# Patient Record
Sex: Male | Born: 1970 | Race: White | Hispanic: No | Marital: Married | Smoking: Current every day smoker
Health system: Southern US, Community
[De-identification: ages and names within clinical notes are randomized; demographics above are authoritative.]

## PROBLEM LIST (undated history)

## (undated) DIAGNOSIS — I10 Essential (primary) hypertension: Secondary | ICD-10-CM

## (undated) DIAGNOSIS — T7840XA Allergy, unspecified, initial encounter: Secondary | ICD-10-CM

## (undated) DIAGNOSIS — N529 Male erectile dysfunction, unspecified: Secondary | ICD-10-CM

## (undated) DIAGNOSIS — E785 Hyperlipidemia, unspecified: Secondary | ICD-10-CM

## (undated) DIAGNOSIS — E039 Hypothyroidism, unspecified: Secondary | ICD-10-CM

## (undated) DIAGNOSIS — N189 Chronic kidney disease, unspecified: Secondary | ICD-10-CM

## (undated) HISTORY — DX: Allergy, unspecified, initial encounter: T78.40XA

## (undated) HISTORY — DX: Hyperlipidemia, unspecified: E78.5

## (undated) HISTORY — DX: Hypothyroidism, unspecified: E03.9

## (undated) HISTORY — PX: GANGLION CYST EXCISION: SHX1691

## (undated) HISTORY — PX: WRIST SURGERY: SHX841

## (undated) HISTORY — DX: Male erectile dysfunction, unspecified: N52.9

## (undated) HISTORY — PX: THUMB AMPUTATION: SHX804

## (undated) HISTORY — DX: Essential (primary) hypertension: I10

## (undated) HISTORY — PX: REPAIR KNEE LIGAMENT: SUR1188

## (undated) HISTORY — DX: Chronic kidney disease, unspecified: N18.9

---

## 2011-03-27 ENCOUNTER — Encounter (HOSPITAL_BASED_OUTPATIENT_CLINIC_OR_DEPARTMENT_OTHER)
Admission: RE | Admit: 2011-03-27 | Discharge: 2011-03-27 | Disposition: A | Discharge status: Home / Self Care | Payer: BC Managed Care – PPO | Source: Ambulatory Visit | Attending: Orthopedic Surgery | Admitting: Orthopedic Surgery

## 2011-03-27 LAB — BASIC METABOLIC PANEL
BUN: 25 mg/dL — ABNORMAL HIGH (ref 6–23)
Chloride: 101 mEq/L (ref 96–112)
Creatinine, Ser: 0.93 mg/dL (ref 0.4–1.5)
GFR calc Af Amer: >60 mL/min (ref >60)
GFR calc non Af Amer: >60 mL/min (ref >60)
Potassium: 3.5 mEq/L (ref 3.5–5.1)

## 2011-03-28 ENCOUNTER — Ambulatory Visit (HOSPITAL_BASED_OUTPATIENT_CLINIC_OR_DEPARTMENT_OTHER)
Admission: RE | Admit: 2011-03-28 | Discharge: 2011-03-28 | Disposition: A | Discharge status: Home / Self Care | Payer: BC Managed Care – PPO | Source: Ambulatory Visit | Attending: Orthopedic Surgery | Admitting: Orthopedic Surgery

## 2011-03-28 ENCOUNTER — Other Ambulatory Visit: Payer: Self-pay | Admitting: Orthopedic Surgery

## 2011-03-28 DIAGNOSIS — I10 Essential (primary) hypertension: Secondary | ICD-10-CM | POA: Insufficient documentation

## 2011-03-28 DIAGNOSIS — Z01812 Encounter for preprocedural laboratory examination: Secondary | ICD-10-CM | POA: Insufficient documentation

## 2011-03-28 DIAGNOSIS — F17200 Nicotine dependence, unspecified, uncomplicated: Secondary | ICD-10-CM | POA: Insufficient documentation

## 2011-03-28 DIAGNOSIS — M6740 Ganglion, unspecified site: Secondary | ICD-10-CM | POA: Insufficient documentation

## 2011-03-28 LAB — POCT HEMOGLOBIN-HEMACUE: Hemoglobin: 14.4 g/dL (ref 13.0–17.0)

## 2011-04-01 NOTE — Op Note (Signed)
NAME:  Jeremiah Turner, Jeremiah Turner NO.:  [id number]  MEDICAL RECORD NO.:  [id number]          PATIENT TYPE:  LOCATION:                                 FACILITY:  PHYSICIAN:  Katy Fitch. Karlen Barbar, M.D.      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  OPERATION:  Resection of complex dorsal myxomatous mass emanating from dorsal aspect of scapholunate ligament.  OPERATIONS:  Resection of dorsal myxomatous mass left wrist with left wrist arthrotomy and debridement of scapholunate ligament and electrocautery of superficial fibers of ligament.  OPERATING SURGEON:  Katy Fitch. Kali Deadwyler, MD  ASSISTANT:  Marveen Reeks Dasnoit, PA-C  ANESTHESIA:  General by LMA.  SUPERVISING ANESTHESIOLOGIST:  Dr. Sampson Goon.  INDICATIONS:  Jeremiah Turner is a 40 year old gentleman referred for evaluation and management of a large dorsal wrist mass.  Clinical examination revealed evidence of a large myxomatous cyst.  This was painful when he placed his hand in dorsiflexion and load-bearing or full palmar flexion.  X-rays of his wrist revealed a cyst within the radial aspect of the lunate consistent with interosseous ganglion.  We detailed informed consent Jeremiah Turner.  Past experience with interosseous cyst of lunate has been unsatisfying with respect to attempts at excision and bone grafting.  I recommended that based on years of experience and in my judgment he will be better off with simple excision of his dorsal cyst and observation  of the interosseous cyst. His cyst did not appear to be large enough to cause a pathologic fracture of the lunate.  After informed consent, he was brought to the operating room at this time.  As part of our informed consent, we did point out that myxoid cyst phenomenon is poorly understood.  There is a high incidence of recurrence.  Questions regarding this particular invited and answered in detail.  PROCEDURE IN DETAILS:  Jeremiah Turner was brought to room 1 of the [facility] and placed in supine position on the operating table.  Following the induction of general anesthesia by LMA technique, the left arm was prepped with Betadine soap and solution and sterilely draped.  A pneumatic tourniquet was applied to the proximal brachium.  On exsanguination of left arm with Esmarch bandage, arterial tourniquet was inflated to 250 mmHg.  Procedure commenced with a short transverse incision over the dorsal aspect of the wrist.  Subcutaneous tissue was carefully divided and taken care to identify the extensor retinaculum and the myxoid cyst.  The cyst had a violaceous appearance due to some blood elements within the myxoid material.  This circumferentially dissected down to the level of the wrist capsule.  The cyst drained of its contents and subsequently excised  with electrocautery following its stalk down to the level of scapholunate ligament.  The midcarpal joint was unremarkable.  The radiocarpal joint was entered in a very small area to allow exposure of the dorsal aspect of scapholunate ligament.  This was cleaned to normal appearing fibers with a micro rongeur.  Electrocautery was used to desiccate the cells on the superficial surface of the scapholunate ligament.  The wound was then irrigated followed by repair of the skin with subcutaneous suture of 4-0 Vicryl and intradermal 3-0 Prolene.  A compressive dressing was applied with a gauze pad directly over the dead space created by  cyst excision.  There were no apparent complications.  For aftercare, Jeremiah Turner is provided prescription for Vicodin 5 mg 1 p.o. q.4-6 h. p.r.n. pain.  He was provided 1 g of Ancef IV prophylactic antibiotic due to joint entry.     Katy Fitch Mindy Gali, M.D.     RVS/MEDQ  D:  03/28/2011  T:  03/29/2011  Job:  [id number]  cc:   Jeremiah Turner  Electronically Signed by Josephine Igo M.D. on 04/01/2011 08:27:44 AM

## 2012-11-18 ENCOUNTER — Encounter: Payer: Self-pay | Admitting: Internal Medicine

## 2012-12-06 ENCOUNTER — Ambulatory Visit: Payer: BC Managed Care – PPO | Admitting: Internal Medicine

## 2014-01-19 ENCOUNTER — Other Ambulatory Visit: Payer: Self-pay | Admitting: Dermatology

## 2017-07-23 DIAGNOSIS — Z720 Tobacco use: Secondary | ICD-10-CM | POA: Diagnosis not present

## 2017-07-23 DIAGNOSIS — A630 Anogenital (venereal) warts: Secondary | ICD-10-CM | POA: Diagnosis not present

## 2017-07-23 DIAGNOSIS — Z1389 Encounter for screening for other disorder: Secondary | ICD-10-CM | POA: Diagnosis not present

## 2017-07-23 DIAGNOSIS — I10 Essential (primary) hypertension: Secondary | ICD-10-CM | POA: Diagnosis not present

## 2017-07-23 DIAGNOSIS — E784 Other hyperlipidemia: Secondary | ICD-10-CM | POA: Diagnosis not present

## 2017-08-31 ENCOUNTER — Other Ambulatory Visit: Payer: Self-pay | Admitting: Urology

## 2017-08-31 DIAGNOSIS — A630 Anogenital (venereal) warts: Secondary | ICD-10-CM | POA: Diagnosis not present

## 2017-08-31 NOTE — H&P (Signed)
Urology Preoperative H&P   Chief Complaint: Penile condyloma  History of Present Illness: Jeremiah Turner is a 46 y.o. 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  He states that he has noticed the lesions for approximately 5 years and has tried topical therapy with little positive results.  He also reports penile pain and skin discomfort, secondary to the lesions, when having intercourse    Past Medical History:  Diagnosis Date  . ED (erectile dysfunction)   . Hyperlipidemia   . Hypertension   . Hypothyroidism     Past Surgical History:  Procedure Laterality Date  . GANGLION CYST EXCISION    . REPAIR KNEE LIGAMENT    . THUMB AMPUTATION    . WRIST SURGERY      Allergies: Allergies not on file  No family history on file.  Social History:  reports that he has been smoking.  He does not have any smokeless tobacco history on file. He reports that he does not drink alcohol or use drugs.  ROS: A complete review of systems was performed.  All systems are negative except for pertinent findings as noted.  Physical Exam:  Vital signs in last 24 hours: BP: ()/()  Arterial Line BP: ()/()  Constitutional:  Alert and oriented, No acute distress Cardiovascular: Regular rate and rhythm, No JVD Respiratory: Normal respiratory effort, Lungs clear bilaterally GI: Abdomen is soft, nontender, nondistended, no abdominal masses GU: No CVA tenderness,  Two 1 cm penile condyloma involving the left lateral base and a 2 mm condyloma involving the right lateral distal penile shaft Lymphatic: No lymphadenopathy Neurologic: Grossly intact, no focal deficits Psychiatric: Normal mood and affect   Laboratory Data:  No results for input(s): WBC, HGB, HCT, PLT in the last 72 hours.  No results for input(s): NA, K, CL, GLUCOSE, BUN, CALCIUM, CREATININE in the last 72 hours.  Invalid input(s): CO3   No results found for this or any previous visit  (from the past 24 hour(s)). No results found for this or any previous visit (from the past 240 hour(s)).  Renal Function: No results for input(s): CREATININE in the last 168 hours. CrCl cannot be calculated (Patient's most recent lab result is older than the maximum 21 days allowed.).  Radiologic Imaging: No results found.  I independently reviewed the above imaging studies.  Assessment and Plan Jeremiah Turner is a 46 y.o. 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  Risks include bleeding, infection, recurrence of the lesions and scar tissue formation.  He voices understanding and wishes to proceed.     Rhoderick Moody, MD 08/31/2017, 12:20 PM  [facility] Pager: [phone]

## 2017-11-04 ENCOUNTER — Encounter (HOSPITAL_BASED_OUTPATIENT_CLINIC_OR_DEPARTMENT_OTHER): Admission: RE | Payer: Self-pay | Source: Ambulatory Visit

## 2017-11-04 ENCOUNTER — Ambulatory Visit (HOSPITAL_BASED_OUTPATIENT_CLINIC_OR_DEPARTMENT_OTHER): Admission: RE | Admit: 2017-11-04 | Payer: BLUE CROSS/BLUE SHIELD | Source: Ambulatory Visit | Admitting: Urology

## 2017-11-04 SURGERY — DESTRUCTION, LESION, GENITALIA
Anesthesia: Monitor Anesthesia Care

## 2018-01-29 DIAGNOSIS — Z1389 Encounter for screening for other disorder: Secondary | ICD-10-CM | POA: Diagnosis not present

## 2018-01-29 DIAGNOSIS — I10 Essential (primary) hypertension: Secondary | ICD-10-CM | POA: Diagnosis not present

## 2018-01-29 DIAGNOSIS — Z720 Tobacco use: Secondary | ICD-10-CM | POA: Diagnosis not present

## 2018-01-29 DIAGNOSIS — A630 Anogenital (venereal) warts: Secondary | ICD-10-CM | POA: Diagnosis not present

## 2018-04-27 DIAGNOSIS — A630 Anogenital (venereal) warts: Secondary | ICD-10-CM | POA: Insufficient documentation

## 2018-06-11 DIAGNOSIS — I10 Essential (primary) hypertension: Secondary | ICD-10-CM | POA: Diagnosis not present

## 2018-06-11 DIAGNOSIS — N528 Other male erectile dysfunction: Secondary | ICD-10-CM | POA: Diagnosis not present

## 2018-06-11 DIAGNOSIS — A630 Anogenital (venereal) warts: Secondary | ICD-10-CM | POA: Diagnosis not present

## 2018-06-11 DIAGNOSIS — Z720 Tobacco use: Secondary | ICD-10-CM | POA: Diagnosis not present

## 2018-07-08 DIAGNOSIS — A630 Anogenital (venereal) warts: Secondary | ICD-10-CM | POA: Diagnosis not present

## 2018-07-22 DIAGNOSIS — R82998 Other abnormal findings in urine: Secondary | ICD-10-CM | POA: Diagnosis not present

## 2018-07-23 DIAGNOSIS — Z125 Encounter for screening for malignant neoplasm of prostate: Secondary | ICD-10-CM | POA: Diagnosis not present

## 2018-07-23 DIAGNOSIS — E038 Other specified hypothyroidism: Secondary | ICD-10-CM | POA: Diagnosis not present

## 2018-07-23 DIAGNOSIS — Z0000 Encounter for general adult medical examination without abnormal findings: Secondary | ICD-10-CM | POA: Diagnosis not present

## 2018-07-23 DIAGNOSIS — I10 Essential (primary) hypertension: Secondary | ICD-10-CM | POA: Diagnosis not present

## 2018-07-26 DIAGNOSIS — Z720 Tobacco use: Secondary | ICD-10-CM | POA: Diagnosis not present

## 2018-07-26 DIAGNOSIS — N528 Other male erectile dysfunction: Secondary | ICD-10-CM | POA: Diagnosis not present

## 2018-07-26 DIAGNOSIS — A630 Anogenital (venereal) warts: Secondary | ICD-10-CM | POA: Diagnosis not present

## 2018-07-26 DIAGNOSIS — I10 Essential (primary) hypertension: Secondary | ICD-10-CM | POA: Diagnosis not present

## 2018-07-26 DIAGNOSIS — Z0000 Encounter for general adult medical examination without abnormal findings: Secondary | ICD-10-CM | POA: Diagnosis not present

## 2018-07-30 DIAGNOSIS — Z1212 Encounter for screening for malignant neoplasm of rectum: Secondary | ICD-10-CM | POA: Diagnosis not present

## 2018-08-30 DIAGNOSIS — M542 Cervicalgia: Secondary | ICD-10-CM | POA: Diagnosis not present

## 2018-08-30 DIAGNOSIS — M4722 Other spondylosis with radiculopathy, cervical region: Secondary | ICD-10-CM | POA: Diagnosis not present

## 2018-09-07 DIAGNOSIS — M542 Cervicalgia: Secondary | ICD-10-CM | POA: Diagnosis not present

## 2018-10-21 DIAGNOSIS — G959 Disease of spinal cord, unspecified: Secondary | ICD-10-CM | POA: Diagnosis not present

## 2018-10-21 DIAGNOSIS — M542 Cervicalgia: Secondary | ICD-10-CM | POA: Diagnosis not present

## 2018-10-21 DIAGNOSIS — M5412 Radiculopathy, cervical region: Secondary | ICD-10-CM | POA: Diagnosis not present

## 2018-10-21 DIAGNOSIS — M4802 Spinal stenosis, cervical region: Secondary | ICD-10-CM | POA: Diagnosis not present

## 2018-11-04 DIAGNOSIS — M5020 Other cervical disc displacement, unspecified cervical region: Secondary | ICD-10-CM | POA: Diagnosis not present

## 2018-11-04 DIAGNOSIS — M4712 Other spondylosis with myelopathy, cervical region: Secondary | ICD-10-CM | POA: Diagnosis not present

## 2018-11-04 DIAGNOSIS — M50023 Cervical disc disorder at C6-C7 level with myelopathy: Secondary | ICD-10-CM | POA: Diagnosis not present

## 2018-11-04 DIAGNOSIS — M50323 Other cervical disc degeneration at C6-C7 level: Secondary | ICD-10-CM | POA: Diagnosis not present

## 2018-11-04 DIAGNOSIS — M4802 Spinal stenosis, cervical region: Secondary | ICD-10-CM | POA: Diagnosis not present

## 2018-12-08 DIAGNOSIS — M5412 Radiculopathy, cervical region: Secondary | ICD-10-CM | POA: Diagnosis not present

## 2019-01-12 ENCOUNTER — Ambulatory Visit: Payer: Self-pay

## 2019-01-12 ENCOUNTER — Other Ambulatory Visit: Payer: Self-pay | Admitting: Family Medicine

## 2019-01-12 DIAGNOSIS — S20211A Contusion of right front wall of thorax, initial encounter: Secondary | ICD-10-CM

## 2019-01-27 DIAGNOSIS — Z720 Tobacco use: Secondary | ICD-10-CM | POA: Diagnosis not present

## 2019-01-27 DIAGNOSIS — E038 Other specified hypothyroidism: Secondary | ICD-10-CM | POA: Diagnosis not present

## 2019-01-27 DIAGNOSIS — I10 Essential (primary) hypertension: Secondary | ICD-10-CM | POA: Diagnosis not present

## 2019-09-23 IMAGING — DX DG RIBS W/ CHEST 3+V*R*
3 series · 3 of 3 positions shown · non-contrast
Comparison: None.

CLINICAL DATA: Right anterior chest wall pain since falling
yesterday.

EXAM:
RIGHT RIBS AND CHEST - 3+ VIEW

[chest pa]
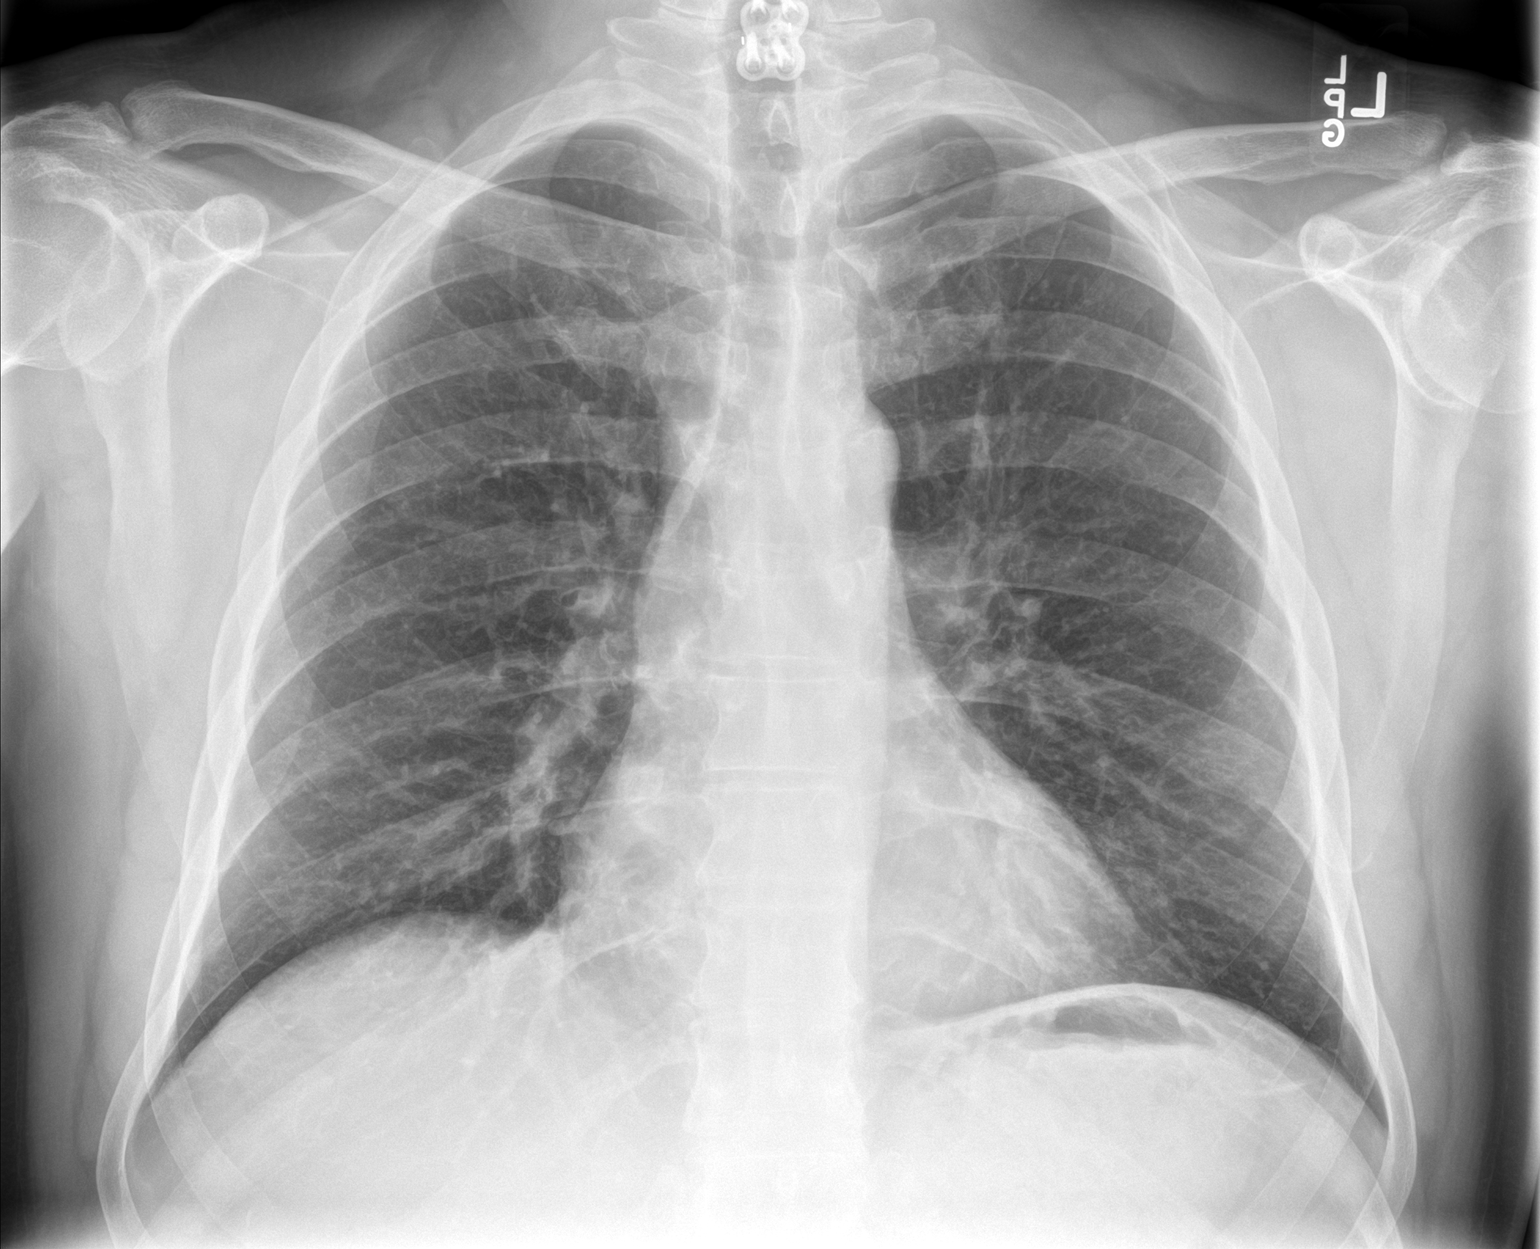

[rib pa]
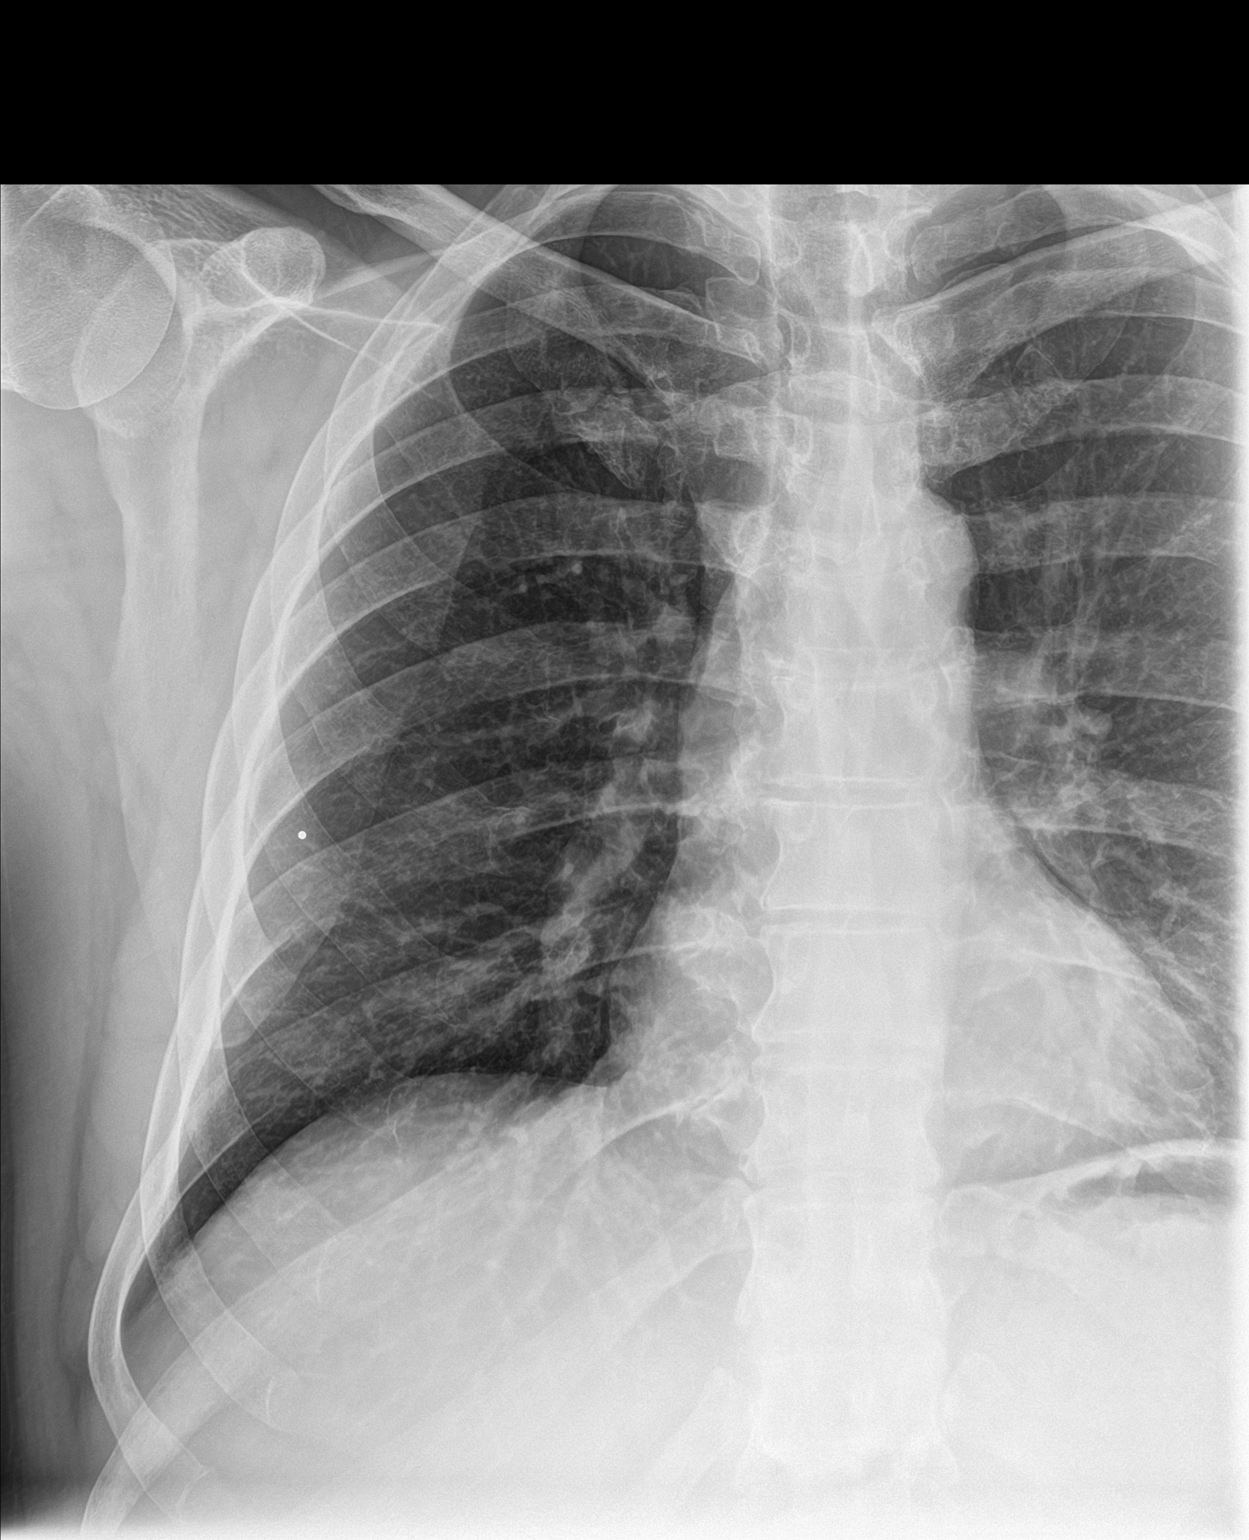

[rib ap]
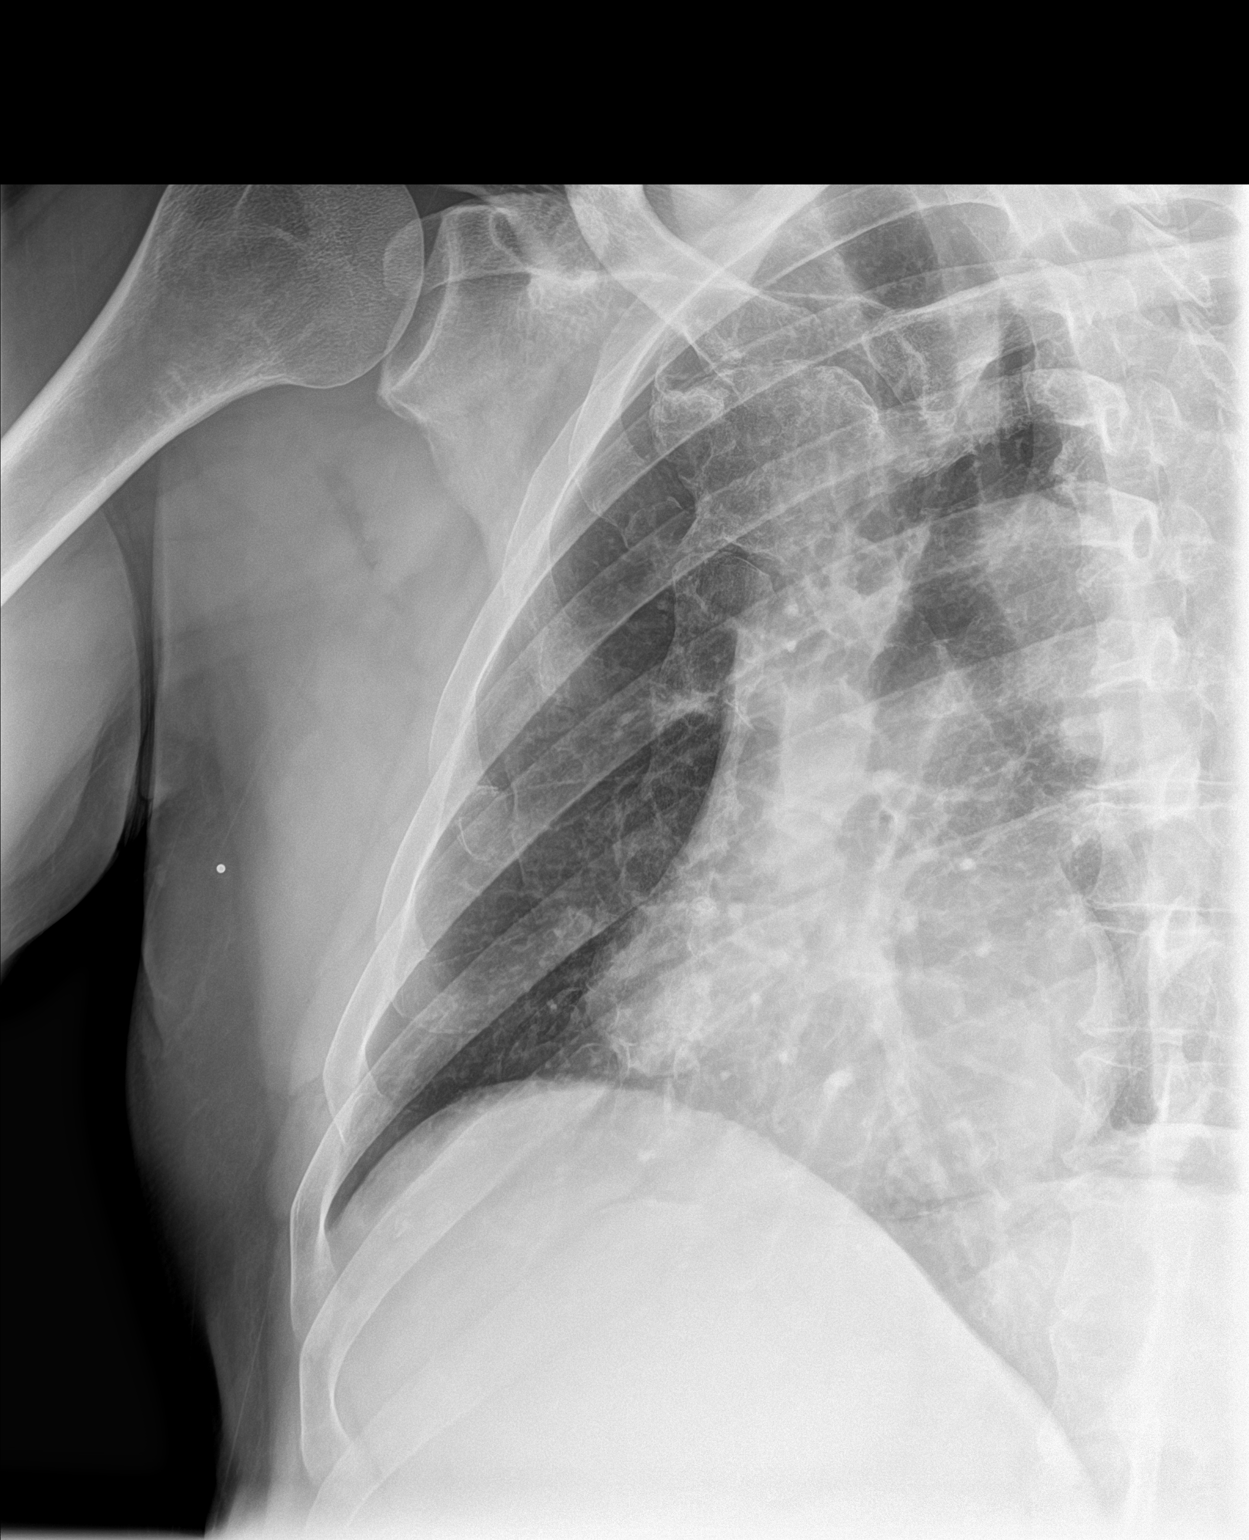

[3 of 3 positions shown; findings below may reference images not displayed]

FINDINGS: The heart size and mediastinal contours are normal. The lungs are
clear. There is no pleural effusion or pneumothorax. Patient is
status post lower cervical fusion.

Right rib series performed with a metallic BB over the area of pain
anteriorly in the mid right chest. No rib fracture or focal rib
lesion identified.
IMPRESSION: No evidence of acute right-sided rib fracture, pleural effusion or
pneumothorax.

## 2020-07-10 DIAGNOSIS — Z125 Encounter for screening for malignant neoplasm of prostate: Secondary | ICD-10-CM | POA: Diagnosis not present

## 2020-07-10 DIAGNOSIS — E7849 Other hyperlipidemia: Secondary | ICD-10-CM | POA: Diagnosis not present

## 2020-07-10 DIAGNOSIS — D563 Thalassemia minor: Secondary | ICD-10-CM | POA: Diagnosis not present

## 2020-07-10 DIAGNOSIS — I10 Essential (primary) hypertension: Secondary | ICD-10-CM | POA: Diagnosis not present

## 2020-07-10 DIAGNOSIS — E038 Other specified hypothyroidism: Secondary | ICD-10-CM | POA: Diagnosis not present

## 2020-07-10 DIAGNOSIS — Z0000 Encounter for general adult medical examination without abnormal findings: Secondary | ICD-10-CM | POA: Diagnosis not present

## 2021-07-05 DIAGNOSIS — E785 Hyperlipidemia, unspecified: Secondary | ICD-10-CM | POA: Diagnosis not present

## 2021-07-05 DIAGNOSIS — E039 Hypothyroidism, unspecified: Secondary | ICD-10-CM | POA: Diagnosis not present

## 2021-07-05 DIAGNOSIS — Z125 Encounter for screening for malignant neoplasm of prostate: Secondary | ICD-10-CM | POA: Diagnosis not present

## 2021-07-12 DIAGNOSIS — Z1339 Encounter for screening examination for other mental health and behavioral disorders: Secondary | ICD-10-CM | POA: Diagnosis not present

## 2021-07-12 DIAGNOSIS — Z0000 Encounter for general adult medical examination without abnormal findings: Secondary | ICD-10-CM | POA: Diagnosis not present

## 2021-07-12 DIAGNOSIS — I10 Essential (primary) hypertension: Secondary | ICD-10-CM | POA: Diagnosis not present

## 2021-07-12 DIAGNOSIS — Z1331 Encounter for screening for depression: Secondary | ICD-10-CM | POA: Diagnosis not present

## 2022-08-26 DIAGNOSIS — Z125 Encounter for screening for malignant neoplasm of prostate: Secondary | ICD-10-CM | POA: Diagnosis not present

## 2022-08-26 DIAGNOSIS — E785 Hyperlipidemia, unspecified: Secondary | ICD-10-CM | POA: Diagnosis not present

## 2022-08-26 DIAGNOSIS — Z1212 Encounter for screening for malignant neoplasm of rectum: Secondary | ICD-10-CM | POA: Diagnosis not present

## 2022-08-26 DIAGNOSIS — E039 Hypothyroidism, unspecified: Secondary | ICD-10-CM | POA: Diagnosis not present

## 2022-09-02 DIAGNOSIS — Z0000 Encounter for general adult medical examination without abnormal findings: Secondary | ICD-10-CM | POA: Diagnosis not present

## 2022-09-02 DIAGNOSIS — Z1339 Encounter for screening examination for other mental health and behavioral disorders: Secondary | ICD-10-CM | POA: Diagnosis not present

## 2022-09-02 DIAGNOSIS — Z1331 Encounter for screening for depression: Secondary | ICD-10-CM | POA: Diagnosis not present

## 2022-09-02 DIAGNOSIS — I10 Essential (primary) hypertension: Secondary | ICD-10-CM | POA: Diagnosis not present

## 2022-09-02 DIAGNOSIS — R82998 Other abnormal findings in urine: Secondary | ICD-10-CM | POA: Diagnosis not present

## 2023-04-13 ENCOUNTER — Encounter: Payer: Self-pay | Admitting: Family Medicine

## 2023-06-11 ENCOUNTER — Encounter: Payer: Self-pay | Admitting: Family Medicine

## 2023-06-11 ENCOUNTER — Ambulatory Visit (INDEPENDENT_AMBULATORY_CARE_PROVIDER_SITE_OTHER): Payer: Self-pay | Admitting: Family Medicine

## 2023-06-11 VITALS — BP 132/88 | HR 72 | Temp 97.6°F | Ht 71.0 in | Wt 203.0 lb

## 2023-06-11 DIAGNOSIS — E039 Hypothyroidism, unspecified: Secondary | ICD-10-CM

## 2023-06-11 DIAGNOSIS — F419 Anxiety disorder, unspecified: Secondary | ICD-10-CM

## 2023-06-11 DIAGNOSIS — F17210 Nicotine dependence, cigarettes, uncomplicated: Secondary | ICD-10-CM

## 2023-06-11 DIAGNOSIS — E785 Hyperlipidemia, unspecified: Secondary | ICD-10-CM

## 2023-06-11 DIAGNOSIS — I10 Essential (primary) hypertension: Secondary | ICD-10-CM

## 2023-06-11 DIAGNOSIS — Z7689 Persons encountering health services in other specified circumstances: Secondary | ICD-10-CM

## 2023-06-11 LAB — CBC WITH DIFFERENTIAL/PLATELET
Basophils Absolute: 0.1 10*3/uL (ref 0.0–0.1)
Basophils Relative: 0.5 % (ref 0.0–3.0)
Eosinophils Absolute: 0.1 10*3/uL (ref 0.0–0.7)
Eosinophils Relative: 0.9 % (ref 0.0–5.0)
HCT: 41.2 % (ref 39.0–52.0)
Hemoglobin: 13 g/dL (ref 13.0–17.0)
Lymphocytes Relative: 33.4 % (ref 12.0–46.0)
Lymphs Abs: 4.6 10*3/uL — ABNORMAL HIGH (ref 0.7–4.0)
MCHC: 31.5 g/dL (ref 30.0–36.0)
MCV: 60.9 fl — ABNORMAL LOW (ref 78.0–100.0)
Monocytes Absolute: 0.9 10*3/uL (ref 0.1–1.0)
Monocytes Relative: 6.6 % (ref 3.0–12.0)
Neutro Abs: 8.1 10*3/uL — ABNORMAL HIGH (ref 1.4–7.7)
Neutrophils Relative %: 58.6 % (ref 43.0–77.0)
Platelets: 293 10*3/uL (ref 150.0–400.0)
RBC: 6.76 Mil/uL — ABNORMAL HIGH (ref 4.22–5.81)
RDW: 16.3 % — ABNORMAL HIGH (ref 11.5–15.5)
WBC: 13.7 10*3/uL — ABNORMAL HIGH (ref 4.0–10.5)

## 2023-06-11 LAB — COMPREHENSIVE METABOLIC PANEL
ALT: 26 U/L (ref 0–53)
AST: 16 U/L (ref 0–37)
Albumin: 4.6 g/dL (ref 3.5–5.2)
Alkaline Phosphatase: 67 U/L (ref 39–117)
BUN: 25 mg/dL — ABNORMAL HIGH (ref 6–23)
CO2: 28 mEq/L (ref 19–32)
Calcium: 9.5 mg/dL (ref 8.4–10.5)
Chloride: 105 mEq/L (ref 96–112)
Creatinine, Ser: 1.01 mg/dL (ref 0.40–1.50)
GFR: 84.58 mL/min (ref >60.00)
Glucose, Bld: 89 mg/dL (ref 70–99)
Potassium: 4.1 mEq/L (ref 3.5–5.1)
Sodium: 140 mEq/L (ref 135–145)
Total Bilirubin: 0.5 mg/dL (ref 0.2–1.2)
Total Protein: 7.1 g/dL (ref 6.0–8.3)

## 2023-06-11 LAB — TSH: TSH: 3.01 u[IU]/mL (ref 0.35–5.50)

## 2023-06-11 MED ORDER — LEVOTHYROXINE SODIUM 100 MCG PO TABS: 100.0000 ug | ORAL_TABLET | Freq: Every day | ORAL | 1 refills | Status: DC

## 2023-06-11 MED ORDER — AMLODIPINE BESYLATE 5 MG PO TABS: 5.0000 mg | ORAL_TABLET | Freq: Every day | ORAL | 1 refills | Status: DC

## 2023-06-11 NOTE — Progress Notes (Addendum)
New Patient Office Visit  Subjective    Patient ID: Jeremiah Turner, male    DOB: [birth year]  Age: 52 y.o. MRN: [id number]  CC:  Chief Complaint  Patient presents with   Establish Care    No concerns, wants refills    HPI Jeremiah Turner presents to establish care Previous medical care: Dr. Ivery Quale   HTN- out of amlodipine 5 mg x 1 week.   Hypothyroidism- 100 mg   Anxiety and stress- Sertraline 100 mg   HLD- Crestor daily   Smokes 1- 1.5 ppd x 15 years and less prior to that Started smoking at age 16    Works at [employer] in maintenance.  Married  1 son      06/11/2023    3:52 PM  Depression screen PHQ 2/9  Decreased Interest 0  Down, Depressed, Hopeless 0  PHQ - 2 Score 0     Outpatient Encounter Medications as of 06/11/2023  Medication Sig   rosuvastatin (CRESTOR) 20 MG tablet Take 20 mg by mouth daily.   sertraline (ZOLOFT) 100 MG tablet    [DISCONTINUED] amLODipine (NORVASC) 5 MG tablet Take 5 mg by mouth daily.   [DISCONTINUED] levothyroxine (SYNTHROID) 100 MCG tablet Take by mouth.   amLODipine (NORVASC) 5 MG tablet Take 1 tablet (5 mg total) by mouth daily.   levothyroxine (SYNTHROID) 100 MCG tablet Take 1 tablet (100 mcg total) by mouth daily before breakfast.   No facility-administered encounter medications on file as of 06/11/2023.    Past Medical History:  Diagnosis Date   ED (erectile dysfunction)    Hyperlipidemia    Hypertension    Hypothyroidism     Past Surgical History:  Procedure Laterality Date   GANGLION CYST EXCISION     REPAIR KNEE LIGAMENT     THUMB AMPUTATION     WRIST SURGERY      History reviewed. No pertinent family history.  Social History   Socioeconomic History   Marital status: Married    Spouse name: Not on file   Number of children: 1   Years of education: Not on file   Highest education level: Not on file  Occupational History    Employer: [employer]  Tobacco Use   Smoking status: Every Day    Current  packs/day: 1.50    Average packs/day: 1.5 packs/day for 30.0 years (45.0 ttl pk-yrs)    Types: Cigarettes   Smokeless tobacco: Not on file  Substance and Sexual Activity   Alcohol use: Yes    Alcohol/week: 4.0 standard drinks of alcohol    Types: 4 Standard drinks or equivalent per week   Drug use: No   Sexual activity: Yes    Birth control/protection: None  Other Topics Concern   Not on file  Social History Narrative   Not on file   Social Determinants of Health   Financial Resource Strain: Not on file  Food Insecurity: Not on file  Transportation Needs: Not on file  Physical Activity: Not on file  Stress: Not on file  Social Connections: Unknown (04/13/2022)   Received from [facility]   Social Network    Social Network: Not on file  Intimate Partner Violence: Unknown (03/06/2022)   Received from [facility]   HITS    Physically Hurt: Not on file    Insult or Talk Down To: Not on file    Threaten Physical Harm: Not on file    Scream or Curse: Not on file  Review of Systems  Constitutional:  Negative for chills and fever.  Respiratory:  Negative for cough and shortness of breath.   Cardiovascular:  Negative for chest pain and palpitations.  Gastrointestinal:  Negative for abdominal pain, constipation, diarrhea, nausea and vomiting.  Genitourinary:  Negative for dysuria, frequency and urgency.  Neurological:  Negative for dizziness, weakness and headaches.  Psychiatric/Behavioral:  Negative for depression. The patient is not nervous/anxious.         Objective    BP 132/88 (BP Location: Left Arm, Patient Position: Sitting, Cuff Size: Large)   Pulse 72   Temp 97.6 F (36.4 C) (Temporal)   Ht 5\' 11"  (1.803 m)   Wt 203 lb (92.1 kg)   SpO2 97%   BMI 28.31 kg/m   Physical Exam Constitutional:      General: He is not in acute distress.    Appearance: He is not ill-appearing.  Eyes:     Extraocular Movements: Extraocular movements intact.      Conjunctiva/sclera: Conjunctivae normal.  Cardiovascular:     Rate and Rhythm: Normal rate and regular rhythm.  Pulmonary:     Effort: Pulmonary effort is normal.     Breath sounds: Normal breath sounds.  Musculoskeletal:     Cervical back: Normal range of motion and neck supple.  Skin:    General: Skin is warm and dry.  Neurological:     General: No focal deficit present.     Mental Status: He is alert and oriented to person, place, and time.  Psychiatric:        Mood and Affect: Mood normal.        Behavior: Behavior normal.        Thought Content: Thought content normal.         Assessment & Plan:   Problem List Items Addressed This Visit   None Visit Diagnoses     Primary hypertension    -  Primary   Relevant Medications   rosuvastatin (CRESTOR) 20 MG tablet   amLODipine (NORVASC) 5 MG tablet   Other Relevant Orders   CBC with Differential/Platelet (Completed)   Comprehensive metabolic panel (Completed)   TSH (Completed)   Encounter to establish care       Acquired hypothyroidism       Relevant Medications   levothyroxine (SYNTHROID) 100 MCG tablet   Other Relevant Orders   TSH (Completed)   Hyperlipidemia, unspecified hyperlipidemia type       Relevant Medications   rosuvastatin (CRESTOR) 20 MG tablet   amLODipine (NORVASC) 5 MG tablet   Anxiety       Relevant Medications   sertraline (ZOLOFT) 100 MG tablet   Smoking greater than 30 pack years           He is a pleasant 52 year old male who is new to the practice and here to establish care.  Restart amlodipine.  Monitor blood pressure.  Recommend low-sodium diet.  Recommend smoking cessation.  Check renal function. Check TSH and refill levothyroxine as appropriate. He is not fasting.  He will return for fasting CPE.  Anxiety is controlled on sertraline. Request medical records from previous PCP office.  Return for fasting CPE at their convenience.   Hetty Blend, NP-C

## 2023-06-11 NOTE — Addendum Note (Signed)
Addended by: Avanell Shackleton on: 06/11/2023 08:27 PM   Modules accepted: Orders

## 2023-06-12 ENCOUNTER — Encounter: Payer: Self-pay | Admitting: Family Medicine

## 2023-06-12 DIAGNOSIS — I10 Essential (primary) hypertension: Secondary | ICD-10-CM

## 2023-06-12 DIAGNOSIS — E039 Hypothyroidism, unspecified: Secondary | ICD-10-CM

## 2023-06-12 MED ORDER — LEVOTHYROXINE SODIUM 100 MCG PO TABS: 100.0000 ug | ORAL_TABLET | Freq: Every day | ORAL | 1 refills | Status: DC

## 2023-06-12 MED ORDER — AMLODIPINE BESYLATE 5 MG PO TABS: 5.0000 mg | ORAL_TABLET | Freq: Every day | ORAL | 1 refills | Status: DC

## 2024-01-01 ENCOUNTER — Other Ambulatory Visit: Payer: Self-pay | Admitting: Family Medicine

## 2024-01-01 DIAGNOSIS — E039 Hypothyroidism, unspecified: Secondary | ICD-10-CM

## 2024-01-01 DIAGNOSIS — I10 Essential (primary) hypertension: Secondary | ICD-10-CM

## 2024-05-19 ENCOUNTER — Other Ambulatory Visit: Payer: Self-pay | Admitting: Family Medicine

## 2024-05-19 NOTE — Telephone Encounter (Unsigned)
 Copied from CRM [id number]. Topic: Clinical - Medication Refill >> May 19, 2024 11:14 AM Trula Gable C wrote: Medication: rosuvastatin (CRESTOR) 20 MG tablet  Has the patient contacted their pharmacy? Yes (Agent: If no, request that the patient contact the pharmacy for the refill. If patient does not wish to contact the pharmacy document the reason why and proceed with request.) (Agent: If yes, when and what did the pharmacy advise?)  This is the patient's preferred pharmacy:  [id number] [place], [place] - [address] [place] [address] Phone: [phone] Fax: [phone]    Is this the correct pharmacy for this prescription? Yes If no, delete pharmacy and type the correct one.   Has the prescription been filled recently? No  Is the patient out of the medication? Yes  Has the patient been seen for an appointment in the last year OR does the patient have an upcoming appointment? Yes  Can we respond through MyChart? Yes  Agent: Please be advised that Rx refills may take up to 3 business days. We ask that you follow-up with your pharmacy.

## 2024-07-05 ENCOUNTER — Other Ambulatory Visit: Payer: Self-pay | Admitting: Family Medicine

## 2024-07-06 ENCOUNTER — Other Ambulatory Visit: Payer: Self-pay | Admitting: Family Medicine

## 2024-07-06 NOTE — Telephone Encounter (Signed)
 Copied from CRM #[id number]. Topic: Clinical - Medication Refill >> Jul 06, 2024 12:22 PM [place] A wrote: Medication: sertraline (ZOLOFT) 100 MG tablet;rosuvastatin (CRESTOR) 20 MG tablet  Has the patient contacted their pharmacy? No (Agent: If no, request that the patient contact the pharmacy for the refill. If patient does not wish to contact the pharmacy document the reason why and proceed with request.) (Agent: If yes, when and what did the pharmacy advise?)  This is the patient's preferred pharmacy:  [id number] [address], [place] - [address] [place] [address] Phone: [phone] Fax: [phone]   Is this the correct pharmacy for this prescription? Yes If no, delete pharmacy and type the correct one.   Has the prescription been filled recently? No  Is the patient out of the medication? Yes  Has the patient been seen for an appointment in the last year OR does the patient have an upcoming appointment? Yes  Can we respond through MyChart? Yes  Agent: Please be advised that Rx refills may take up to 3 business days. We ask that you follow-up with your pharmacy.

## 2024-07-09 ENCOUNTER — Ambulatory Visit (HOSPITAL_COMMUNITY)
Admission: EM | Admit: 2024-07-09 | Discharge: 2024-07-09 | Disposition: A | Discharge status: Home / Self Care | Payer: Self-pay | Attending: Psychiatry | Admitting: Psychiatry

## 2024-07-09 ENCOUNTER — Inpatient Hospital Stay: Admission: RE | Admit: 2024-07-09 | Payer: Self-pay | Source: Ambulatory Visit

## 2024-07-09 DIAGNOSIS — Z760 Encounter for issue of repeat prescription: Secondary | ICD-10-CM | POA: Insufficient documentation

## 2024-07-09 DIAGNOSIS — F411 Generalized anxiety disorder: Secondary | ICD-10-CM | POA: Diagnosis present

## 2024-07-09 MED ORDER — SERTRALINE HCL 100 MG PO TABS: 100.0000 mg | ORAL_TABLET | Freq: Every day | ORAL | 0 refills | Status: DC

## 2024-07-09 NOTE — Discharge Instructions (Signed)
 Discharge recommendations:   Medications: 14 day supply of Sertraline  100mg  [facility] Patient is to take medications as prescribed. The patient or patient's guardian is to contact a medical professional and/or outpatient provider to address any new side effects that develop. The patient or the patient's guardian should update outpatient providers of any new medications and/or medication changes.    Outpatient Follow up: Please review list of outpatient resources for psychiatry and counseling. Please follow up with your primary care provider for all medical related needs.    Therapy: We recommend that patient participate in individual therapy to address mental health concerns.   Safety:   The following safety precautions should be taken:   No sharp objects. This includes scissors, razors, scrapers, and putty knives.   Chemicals should be removed and locked up.   Medications should be removed and locked up.   Weapons should be removed and locked up. This includes firearms, knives and instruments that can be used to cause injury.   The patient should abstain from use of illicit substances/drugs and abuse of any medications.  If symptoms worsen or do not continue to improve or if the patient becomes actively suicidal or homicidal then it is recommended that the patient return to the closest hospital emergency department, the [facility], or call 911 for further evaluation and treatment. National Suicide Prevention Lifeline 1-800-SUICIDE or 332-351-4428.  About 988 988 offers 24/7 access to trained crisis counselors who can help people experiencing mental health-related distress. People can call or text 988 or chat 988lifeline.org for themselves or if they are worried about a loved one who may need crisis support.

## 2024-07-09 NOTE — ED Provider Notes (Signed)
 Behavioral Health Urgent Care Medical Screening Exam  Patient Name: Jeremiah Turner MRN: [id number] Date of Evaluation: 07/09/24 Chief Complaint:  need my Zoloft  refilled Diagnosis:  Final diagnoses:  Generalized anxiety disorder  Encounter for medication refill    History of Present illness: Jeremiah Turner 53 y.o., male patient presented to [facility] as a voluntary walk in unaccompanied requesting a 2 week supply of his Sertraline  100 mg until he gets his insurance reinstated and can follow up with outpatient. Jeremiah Turner, is seen face to face by this provider and chart reviewed on 07/09/24.  Per chart review pt has a PPHx of GAD and has been prescribed Zoloft  for about 10 years. He is currently being prescribed Sertraline  100 mg daily by his PCP. He states that he is switching insurances with his job and can no longer afford to pay his PCP visit out of pocket. He is states that he should have coverage within the next 2 weeks and just needs enough to bridge him until his insurance is active and he can continue receiving medication mgmt through his PCP. Dispense report was verified and pt did receive 90 day supply of Sertraline  100mg  April 2025 and has since ran out. He reports feeling anxious and brain fuzziness since running out 4 days ago. He denies SI, HI and psychotic symptoms. He reports sleeping well and having a good appetite. We discussed following up with PCP but also provided resources for outpatient psychiatry and therapy to help with anxiety symptoms. 14 day supply of medication sent to pharmacy.    During evaluation Jeremiah Turner is walking around outside, in no acute distress.  He is alert & oriented x 4, calm, cooperative and attentive for this assessment.  His mood is anxious with congruent affect.  He has normal speech, and behavior.  Objectively there is no evidence of psychosis/mania or delusional thinking. Pt does not appear to be responding to internal or external stimuli.   Patient is able to converse coherently, goal directed thoughts, no distractibility, or pre-occupation.  He denies current/ recent suicidal/self-harm/homicidal ideation, psychosis, and paranoia.  Patient answered assessment questions appropriately.      Flowsheet Row ED from 07/09/2024 in [facility]  C-SSRS RISK CATEGORY No Risk    Psychiatric Specialty Exam  Presentation  General Appearance:Appropriate for Environment  Eye Contact:Good  Speech:Clear and Coherent  Speech Volume:Normal  Handedness:Right   Mood and Affect  Mood: Anxious  Affect: Appropriate   Thought Process  Thought Processes: Coherent; Linear  Descriptions of Associations:Intact  Orientation:Full (Time, Place and Person)  Thought Content:WDL    Hallucinations:None  Ideas of Reference:None  Suicidal Thoughts:No  Homicidal Thoughts:No   Sensorium  Memory: Immediate Good; Recent Good  Judgment: Good  Insight: Good   Executive Functions  Concentration: Good  Attention Span: Good  Recall: Good  Fund of Knowledge: Good  Language: Good   Psychomotor Activity  Psychomotor Activity: Normal   Assets  Assets: Desire for Improvement; Housing; Physical Health; Social Support; Vocational/Educational   Sleep  Sleep: Good  Number of hours:  8   Physical Exam: Physical Exam Vitals and nursing note reviewed.  Constitutional:      Appearance: Normal appearance.  HENT:     Head: Normocephalic.     Nose: Nose normal.  Eyes:     Extraocular Movements: Extraocular movements intact.  Cardiovascular:     Rate and Rhythm: Normal rate.  Pulmonary:     Effort: Pulmonary effort  is normal.  Musculoskeletal:        General: Normal range of motion.     Cervical back: Normal range of motion.  Neurological:     General: No focal deficit present.     Mental Status: He is alert and oriented to person, place, and time.    Review of Systems   Constitutional: Negative.   HENT: Negative.    Eyes: Negative.   Respiratory: Negative.    Cardiovascular: Negative.   Gastrointestinal: Negative.   Genitourinary: Negative.   Musculoskeletal: Negative.   Neurological: Negative.   Endo/Heme/Allergies: Negative.   Psychiatric/Behavioral:  The patient is nervous/anxious.    Blood pressure (!) 141/89, pulse 65, temperature 99.2 F (37.3 C), temperature source Oral, resp. rate 18, SpO2 99%. There is no height or weight on file to calculate BMI.  Musculoskeletal: Strength & Muscle Tone: within normal limits Gait & Station: normal Patient leans: N/A   [facility] MSE Discharge Disposition for Follow up and Recommendations: Based on my evaluation the patient does not appear to have an emergency medical condition and can be discharged with resources and follow up care in outpatient services for Medication Management  Pt discharged with 14 day supply of Sertraline  100mg  daily to bridge him until his insurance becomes active and he can follow up with PCP. Educated on the medication. Discussed outpatient psychiatry and therapy options.   Alan JAYSON Mcardle, NP 07/09/2024, 2:41 PM

## 2024-07-09 NOTE — Progress Notes (Signed)
   07/09/24 1347  [facility] Triage Screening (Walk-ins at [facility] only)  How Did You Hear About Us ? Self  What Is the Reason for Your Visit/Call Today? Patient is a 53 year old male that presents as a voluntary walk in this date requesting medication management. Patient is currently prescribed Sertraline  100 mg for the last 10 years when he was diagnosed with MDD. Patient reported he has been prescribed that medication from his PCP Octavia MD) although has recently changed employers and providers. Patient states he now receives services from Orie Mackintosh NP [facility]) and his insurance will not go in effect for another 2 weeks and he can't afford to pay for an office visit to get his medication refilled. Patient states he only needs about two weeks worth, since his insurance will go in to effect until then. Patient denies any SI, HI or AVH.  Patient states he has been off his medication for 4 days now and reported side effects to include: tingling of the extremities and fuzzy headed.  How Long Has This Been Causing You Problems? <Week  Have You Recently Had Any Thoughts About Hurting Yourself? No  Are You Planning to Commit Suicide/Harm Yourself At This time? No  Have you Recently Had Thoughts About Hurting Someone Sherral? No  Are You Planning To Harm Someone At This Time? No  Physical Abuse Denies  Verbal Abuse Denies  Sexual Abuse Denies  Exploitation of patient/patient's resources Denies  Self-Neglect Denies  Possible abuse reported to: Other (Comment) (NA)  Are you currently experiencing any auditory, visual or other hallucinations? No  Have You Used Any Alcohol  or Drugs in the Past 24 Hours? No  Do you have any current medical co-morbidities that require immediate attention? No  Clinician description of patient physical appearance/behavior: Patient is cooperative and pleasant  What Do You Feel Would Help You the Most Today? Medication(s)  If access to [facility] was not available, would  you have sought care in the Emergency Department? No  Determination of Need Routine (7 days)  Options For Referral Medication Management

## 2024-07-22 ENCOUNTER — Ambulatory Visit (INDEPENDENT_AMBULATORY_CARE_PROVIDER_SITE_OTHER): Admitting: Family Medicine

## 2024-07-22 VITALS — BP 108/72 | HR 55 | Temp 97.8°F | Ht 71.0 in | Wt 195.0 lb

## 2024-07-22 DIAGNOSIS — Z125 Encounter for screening for malignant neoplasm of prostate: Secondary | ICD-10-CM

## 2024-07-22 DIAGNOSIS — I10 Essential (primary) hypertension: Secondary | ICD-10-CM

## 2024-07-22 DIAGNOSIS — Z1159 Encounter for screening for other viral diseases: Secondary | ICD-10-CM | POA: Diagnosis not present

## 2024-07-22 DIAGNOSIS — E785 Hyperlipidemia, unspecified: Secondary | ICD-10-CM | POA: Diagnosis not present

## 2024-07-22 DIAGNOSIS — E039 Hypothyroidism, unspecified: Secondary | ICD-10-CM

## 2024-07-22 DIAGNOSIS — Z1211 Encounter for screening for malignant neoplasm of colon: Secondary | ICD-10-CM

## 2024-07-22 DIAGNOSIS — F419 Anxiety disorder, unspecified: Secondary | ICD-10-CM

## 2024-07-22 LAB — COMPREHENSIVE METABOLIC PANEL WITH GFR
ALT: 21 U/L (ref 0–53)
AST: 17 U/L (ref 0–37)
Albumin: 4.6 g/dL (ref 3.5–5.2)
Alkaline Phosphatase: 61 U/L (ref 39–117)
BUN: 21 mg/dL (ref 6–23)
CO2: 27 meq/L (ref 19–32)
Calcium: 9 mg/dL (ref 8.4–10.5)
Chloride: 104 meq/L (ref 96–112)
Creatinine, Ser: 0.79 mg/dL (ref 0.40–1.50)
GFR: 100.24 mL/min (ref >60.00)
Glucose, Bld: 99 mg/dL (ref 70–99)
Potassium: 4.8 meq/L (ref 3.5–5.1)
Sodium: 140 meq/L (ref 135–145)
Total Bilirubin: 0.4 mg/dL (ref 0.2–1.2)
Total Protein: 6.8 g/dL (ref 6.0–8.3)

## 2024-07-22 LAB — CBC WITH DIFFERENTIAL/PLATELET
Basophils Absolute: 0.1 K/uL (ref 0.0–0.1)
Basophils Relative: 1.2 % (ref 0.0–3.0)
Eosinophils Absolute: 0.2 K/uL (ref 0.0–0.7)
Eosinophils Relative: 2 % (ref 0.0–5.0)
HCT: 41.3 % (ref 39.0–52.0)
Hemoglobin: 13 g/dL (ref 13.0–17.0)
Lymphocytes Relative: 38.1 % (ref 12.0–46.0)
Lymphs Abs: 3.5 K/uL (ref 0.7–4.0)
MCHC: 31.4 g/dL (ref 30.0–36.0)
MCV: 61.1 fl — ABNORMAL LOW (ref 78.0–100.0)
Monocytes Absolute: 0.8 K/uL (ref 0.1–1.0)
Monocytes Relative: 8.9 % (ref 3.0–12.0)
Neutro Abs: 4.6 K/uL (ref 1.4–7.7)
Neutrophils Relative %: 49.8 % (ref 43.0–77.0)
Platelets: 266 K/uL (ref 150.0–400.0)
RBC: 6.75 Mil/uL — ABNORMAL HIGH (ref 4.22–5.81)
RDW: 16.6 % — ABNORMAL HIGH (ref 11.5–15.5)
WBC: 9.3 K/uL (ref 4.0–10.5)

## 2024-07-22 LAB — LIPID PANEL
Cholesterol: 211 mg/dL — ABNORMAL HIGH (ref 0–200)
HDL: 39.7 mg/dL (ref >39.00)
LDL Cholesterol: 107 mg/dL — ABNORMAL HIGH (ref 0–99)
NonHDL: 171.48
Total CHOL/HDL Ratio: 5
Triglycerides: 322 mg/dL — ABNORMAL HIGH (ref 0.0–149.0)
VLDL: 64.4 mg/dL — ABNORMAL HIGH (ref 0.0–40.0)

## 2024-07-22 LAB — TSH: TSH: 1.84 u[IU]/mL (ref 0.35–5.50)

## 2024-07-22 LAB — PSA: PSA: 0.9 ng/mL (ref 0.10–4.00)

## 2024-07-22 MED ORDER — ROSUVASTATIN CALCIUM 20 MG PO TABS: 20.0000 mg | ORAL_TABLET | Freq: Every day | ORAL | 1 refills | Status: AC

## 2024-07-22 MED ORDER — AMLODIPINE BESYLATE 5 MG PO TABS: 5.0000 mg | ORAL_TABLET | Freq: Every day | ORAL | 1 refills | Status: AC

## 2024-07-22 MED ORDER — SERTRALINE HCL 100 MG PO TABS: 100.0000 mg | ORAL_TABLET | Freq: Every day | ORAL | 3 refills | Status: AC

## 2024-07-22 NOTE — Progress Notes (Signed)
 Subjective:     Patient ID: Jeremiah Turner, male    DOB: August 13, 1971, 53 y.o.   MRN: [id number]  Chief Complaint  Patient presents with   Medical Management of Chronic Issues    Wants meds refilled    HPI  Discussed the use of AI scribe software for clinical note transcription with the patient, who gave verbal consent to proceed.  History of Present Illness Jeremiah Turner is a 53 year old male with hypertension, hypothyroidism, anxiety, and hyperlipidemia who presents for follow-up on chronic health conditions.  Hypertension - Managed with amlodipine  - Requires medication refill - No dizziness, headaches, chest pain, or palpitations  Hypothyroidism - Treated with levothyroxine  - Currently takes levothyroxine  after eating due to a shift change - Has not taken levothyroxine  today  Anxiety symptoms - Managed with sertraline  - Recent three-day lapse in sertraline  led to discomfort - Two sertraline  pills remaining  Hyperlipidemia and weight management - Treated with rosuvastatin  - Weight decreased from 213 to 195 pounds - Increased physical activity, averaging 15,000 to 18,000 steps daily  Tobacco use - Smokes cigarettes     Health Maintenance Due  Topic Date Due   HIV Screening  Never done   Hepatitis C Screening  Never done   DTaP/Tdap/Td (1 - Tdap) Never done   Pneumococcal Vaccine: 50+ Years (1 of 2 - PCV) Never done   Hepatitis B Vaccines 19-59 Average Risk (1 of 3 - 19+ 3-dose series) Never done   Colonoscopy  Never done   Lung Cancer Screening  Never done   Zoster Vaccines- Shingrix (1 of 2) Never done   COVID-19 Vaccine (4 - 2024-25 season) 08/02/2023   INFLUENZA VACCINE  07/01/2024    Past Medical History:  Diagnosis Date   ED (erectile dysfunction)    Hyperlipidemia    Hypertension    Hypothyroidism     Past Surgical History:  Procedure Laterality Date   GANGLION CYST EXCISION     REPAIR KNEE LIGAMENT     THUMB AMPUTATION     WRIST SURGERY       History reviewed. No pertinent family history.  Social History   Socioeconomic History   Marital status: Married    Spouse name: Not on file   Number of children: 1   Years of education: Not on file   Highest education level: GED or equivalent  Occupational History    Employer: [employer]  Tobacco Use   Smoking status: Every Day    Current packs/day: 1.50    Average packs/day: 1.5 packs/day for 30.0 years (45.0 ttl pk-yrs)    Types: Cigarettes   Smokeless tobacco: Not on file  Substance and Sexual Activity   Alcohol  use: Yes    Alcohol /week: 4.0 standard drinks of alcohol     Types: 4 Standard drinks or equivalent per week   Drug use: No   Sexual activity: Yes    Birth control/protection: None  Other Topics Concern   Not on file  Social History Narrative   Not on file   Social Drivers of Health   Financial Resource Strain: Low Risk  (07/22/2024)   Overall Financial Resource Strain (CARDIA)    Difficulty of Paying Living Expenses: Not very hard  Food Insecurity: No Food Insecurity (07/22/2024)   Hunger Vital Sign    Worried About Running Out of Food in the Last Year: Never true    Ran Out of Food in the Last Year: Never true  Transportation Needs: No Transportation Needs (  07/22/2024)   PRAPARE - Administrator, Civil Service (Medical): No    Lack of Transportation (Non-Medical): No  Physical Activity: Unknown (07/22/2024)   Exercise Vital Sign    Days of Exercise per Week: 5 days    Minutes of Exercise per Session: Not on file  Stress: No Stress Concern Present (07/22/2024)   [employer] of Occupational Health - Occupational Stress Questionnaire    Feeling of Stress: Not at all  Social Connections: Moderately Isolated (07/22/2024)   Social Connection and Isolation Panel    Frequency of Communication with Friends and Family: Once a week    Frequency of Social Gatherings with Friends and Family: Once a week    Attends Religious Services: More than 4  times per year    Active Member of [employer] or Organizations: No    Attends Engineer, structural: Not on file    Marital Status: Married  Intimate Partner Violence: Unknown (03/06/2022)   Received from [facility]   HITS    Physically Hurt: Not on file    Insult or Talk Down To: Not on file    Threaten Physical Harm: Not on file    Scream or Curse: Not on file    Outpatient Medications Prior to Visit  Medication Sig Dispense Refill   levothyroxine  (SYNTHROID ) 100 MCG tablet TAKE 1 TABLET BY MOUTH DAILY BEFORE BREAKFAST. 90 tablet 1   amLODipine  (NORVASC ) 5 MG tablet TAKE 1 TABLET (5 MG TOTAL) BY MOUTH DAILY. 90 tablet 1   rosuvastatin  (CRESTOR ) 20 MG tablet Take 20 mg by mouth daily.     sertraline  (ZOLOFT ) 100 MG tablet Take 1 tablet (100 mg total) by mouth daily. 14 tablet 0   No facility-administered medications prior to visit.    Allergies  Allergen Reactions   Aspirin Swelling    Review of Systems  Constitutional:  Negative for chills, fever and malaise/fatigue.  Eyes:  Negative for blurred vision and double vision.  Respiratory:  Negative for shortness of breath.   Cardiovascular:  Negative for chest pain, palpitations and leg swelling.  Gastrointestinal:  Negative for abdominal pain, constipation, diarrhea, nausea and vomiting.  Genitourinary:  Negative for dysuria, frequency and urgency.  Neurological:  Negative for dizziness, tingling, focal weakness and headaches.  Psychiatric/Behavioral:  Negative for depression. The patient is nervous/anxious.        Objective:    Physical Exam Constitutional:      General: He is not in acute distress.    Appearance: He is not ill-appearing.  HENT:     Mouth/Throat:     Mouth: Mucous membranes are moist.  Eyes:     Extraocular Movements: Extraocular movements intact.     Conjunctiva/sclera: Conjunctivae normal.  Cardiovascular:     Rate and Rhythm: Normal rate.  Pulmonary:     Effort: Pulmonary effort is  normal.  Musculoskeletal:        General: Normal range of motion.     Cervical back: Normal range of motion and neck supple.     Right lower leg: No edema.     Left lower leg: No edema.  Skin:    General: Skin is warm and dry.  Neurological:     General: No focal deficit present.     Mental Status: He is alert and oriented to person, place, and time.     Cranial Nerves: No cranial nerve deficit.     Motor: No weakness.     Coordination: Coordination normal.  Gait: Gait normal.  Psychiatric:        Mood and Affect: Mood normal.        Behavior: Behavior normal.        Thought Content: Thought content normal.      BP 108/72   Pulse (!) 55   Temp 97.8 F (36.6 C) (Temporal)   Ht 5' 11 (1.803 m)   Wt 195 lb (88.5 kg)   SpO2 95%   BMI 27.20 kg/m  Wt Readings from Last 3 Encounters:  07/22/24 195 lb (88.5 kg)  06/11/23 203 lb (92.1 kg)       Assessment & Plan:   Problem List Items Addressed This Visit   None Visit Diagnoses       Acquired hypothyroidism    -  Primary   Relevant Orders   TSH (Completed)     Primary hypertension       Relevant Medications   rosuvastatin  (CRESTOR ) 20 MG tablet   amLODipine  (NORVASC ) 5 MG tablet   Other Relevant Orders   CBC with Differential/Platelet (Completed)   Comprehensive metabolic panel with GFR (Completed)     Encounter for screening for other viral diseases       Relevant Orders   HIV Antibody (routine testing w rflx)   Hepatitis C antibody     Screening for prostate cancer       Relevant Orders   PSA (Completed)     Screen for colon cancer       Relevant Orders   Ambulatory referral to Gastroenterology     Hyperlipidemia, unspecified hyperlipidemia type       Relevant Medications   rosuvastatin  (CRESTOR ) 20 MG tablet   amLODipine  (NORVASC ) 5 MG tablet   Other Relevant Orders   Lipid panel (Completed)     Anxiety       Relevant Medications   sertraline  (ZOLOFT ) 100 MG tablet       Assessment and  Plan Assessment & Plan Hypertension Chronic hypertension, managed with amlodipine . No symptoms such as dizziness, headaches, chest pain, or palpitations. - Refill amlodipine  prescription.  Hypothyroidism Chronic hypothyroidism, managed with levothyroxine . Current dosing may need adjustment based on TSH levels. Inconsistent medication intake on an empty stomach due to shift work. - Order TSH test. - Adjust levothyroxine  dose based on TSH results. - Instruct to take levothyroxine  on an empty stomach   Hyperlipidemia Chronic hyperlipidemia, managed with rosuvastatin . Reports weight loss, potentially beneficial for cholesterol levels. - Refill rosuvastatin  prescription.  Tobacco use Chronic tobacco use. Declined pneumonia vaccine despite smoking history.  Screen for prostate cancer Prostate cancer screening is due. No recent PSA testing. - Order PSA blood test.  Screen for colon cancer No colonoscopy performed. Opted for direct referral to GI for colonoscopy. - Refer to GI for colonoscopy.  Anxiety Chronic anxiety, managed with sertraline . Experienced withdrawal symptoms after running out of medication. Temporary prescription provided by urgent care. - Refill sertraline  prescription.  General Health Maintenance Due for preventive healthcare measures including cancer screenings and vaccinations. Discussed lung cancer screening and tetanus vaccine. To consider lung cancer screening at next physical. Tetanus vaccine status to be checked. - Consider lung cancer screening with low dose CT at next physical. - Check tetanus vaccine status.     I have changed Jeremiah ORN. Nifong's rosuvastatin . I am also having him maintain his levothyroxine , amLODipine , and sertraline .  Meds ordered this encounter  Medications   rosuvastatin  (CRESTOR ) 20 MG tablet    Sig: Take 1 tablet (  20 mg total) by mouth daily.    Dispense:  90 tablet    Refill:  1   amLODipine  (NORVASC ) 5 MG tablet    Sig: Take 1  tablet (5 mg total) by mouth daily.    Dispense:  90 tablet    Refill:  1   sertraline  (ZOLOFT ) 100 MG tablet    Sig: Take 1 tablet (100 mg total) by mouth daily.    Dispense:  90 tablet    Refill:  3    Supervising Provider:   ROLLENE NORRIS A [[id number]]

## 2024-07-23 LAB — HIV ANTIBODY (ROUTINE TESTING W REFLEX): HIV 1&2 Ab, 4th Generation: NONREACTIVE

## 2024-07-23 LAB — HEPATITIS C ANTIBODY: Hepatitis C Ab: NONREACTIVE

## 2024-07-24 ENCOUNTER — Other Ambulatory Visit: Payer: Self-pay | Admitting: Family Medicine

## 2024-07-24 ENCOUNTER — Ambulatory Visit: Payer: Self-pay | Admitting: Family Medicine

## 2024-07-24 DIAGNOSIS — E039 Hypothyroidism, unspecified: Secondary | ICD-10-CM

## 2024-07-24 DIAGNOSIS — E785 Hyperlipidemia, unspecified: Secondary | ICD-10-CM

## 2024-07-24 MED ORDER — LEVOTHYROXINE SODIUM 100 MCG PO TABS: 100.0000 ug | ORAL_TABLET | Freq: Every day | ORAL | 1 refills | Status: AC

## 2024-07-24 NOTE — Progress Notes (Signed)
 Please ask him if he has been missing any doses of rosuvastatin . His LDL and triglycerides are elevated.

## 2024-08-17 ENCOUNTER — Encounter: Payer: Self-pay | Admitting: Family Medicine

## 2024-08-26 ENCOUNTER — Ambulatory Visit: Admitting: Family Medicine

## 2024-09-02 ENCOUNTER — Encounter: Payer: Self-pay | Admitting: Family Medicine

## 2024-09-02 ENCOUNTER — Ambulatory Visit (INDEPENDENT_AMBULATORY_CARE_PROVIDER_SITE_OTHER): Admitting: Family Medicine

## 2024-09-02 VITALS — BP 130/80 | HR 58 | Temp 97.8°F | Ht 71.0 in | Wt 198.8 lb

## 2024-09-02 DIAGNOSIS — E782 Mixed hyperlipidemia: Secondary | ICD-10-CM | POA: Diagnosis not present

## 2024-09-02 DIAGNOSIS — B351 Tinea unguium: Secondary | ICD-10-CM

## 2024-09-02 MED ORDER — TERBINAFINE HCL 250 MG PO TABS: 250.0000 mg | ORAL_TABLET | Freq: Every day | ORAL | 0 refills | Status: DC

## 2024-09-02 NOTE — Progress Notes (Signed)
 Subjective:     Patient ID: Jeremiah LELON Turner, male    DOB: 1970/12/30, 53 y.o.   MRN: [id number]  Chief Complaint  Patient presents with   Acute Visit    Rt great toe fungus ongoing x2 years, used otc meds improved the appearance of the nail but did not get rid of the fungus    HPI  Discussed the use of AI scribe software for clinical note transcription with the patient, who gave verbal consent to proceed.  History of Present Illness Jeremiah Turner is a 53 year old male who presents with potential toenail fungus.  Onychomycosis (toenail changes) - Discoloration and thickening affecting all toenails - Right foot more severely affected than left - Previously prescribed oral antifungal medication but did not complete the course  Dyslipidemia management - Resumed cholesterol medication six weeks ago after a period of discontinuation  Alcohol  use - Consumes approximately four to five alcoholic drinks per session, mainly on weekends, about three days per week - No known history of elevated liver function tests     Health Maintenance Due  Topic Date Due   DTaP/Tdap/Td (1 - Tdap) Never done   Pneumococcal Vaccine: 50+ Years (1 of 2 - PCV) Never done   Hepatitis B Vaccines 19-59 Average Risk (1 of 3 - 19+ 3-dose series) Never done   Colonoscopy  Never done   Lung Cancer Screening  Never done   Zoster Vaccines- Shingrix (1 of 2) Never done   Influenza Vaccine  Never done   COVID-19 Vaccine (4 - 2025-26 season) 08/01/2024    Past Medical History:  Diagnosis Date   ED (erectile dysfunction)    Hyperlipidemia    Hypertension    Hypothyroidism     Past Surgical History:  Procedure Laterality Date   GANGLION CYST EXCISION     REPAIR KNEE LIGAMENT     THUMB AMPUTATION     WRIST SURGERY      History reviewed. No pertinent family history.  Social History   Socioeconomic History   Marital status: Married    Spouse name: Not on file   Number of children: 1   Years of  education: Not on file   Highest education level: GED or equivalent  Occupational History    Employer: [employer]  Tobacco Use   Smoking status: Every Day    Current packs/day: 1.50    Average packs/day: 1.5 packs/day for 30.0 years (45.0 ttl pk-yrs)    Types: Cigarettes   Smokeless tobacco: Not on file  Substance and Sexual Activity   Alcohol  use: Yes    Alcohol /week: 4.0 standard drinks of alcohol     Types: 4 Standard drinks or equivalent per week   Drug use: No   Sexual activity: Yes    Birth control/protection: None  Other Topics Concern   Not on file  Social History Narrative   Not on file   Social Drivers of Health   Financial Resource Strain: Low Risk  (07/22/2024)   Overall Financial Resource Strain (CARDIA)    Difficulty of Paying Living Expenses: Not very hard  Food Insecurity: No Food Insecurity (07/22/2024)   Hunger Vital Sign    Worried About Running Out of Food in the Last Year: Never true    Ran Out of Food in the Last Year: Never true  Transportation Needs: No Transportation Needs (07/22/2024)   PRAPARE - Transportation    Lack of Transportation (Medical): No    Lack of Transportation (Non-Medical): No  Physical Activity: Unknown (07/22/2024)   Exercise Vital Sign    Days of Exercise per Week: 5 days    Minutes of Exercise per Session: Not on file  Stress: No Stress Concern Present (07/22/2024)   [employer] of Occupational Health - Occupational Stress Questionnaire    Feeling of Stress: Not at all  Social Connections: Moderately Isolated (07/22/2024)   Social Connection and Isolation Panel    Frequency of Communication with Friends and Family: Once a week    Frequency of Social Gatherings with Friends and Family: Once a week    Attends Religious Services: More than 4 times per year    Active Member of [employer] or Organizations: No    Attends Engineer, structural: Not on file    Marital Status: Married  Intimate Partner Violence: Unknown (03/06/2022)    Received from [facility]   HITS    Physically Hurt: Not on file    Insult or Talk Down To: Not on file    Threaten Physical Harm: Not on file    Scream or Curse: Not on file    Outpatient Medications Prior to Visit  Medication Sig Dispense Refill   amLODipine  (NORVASC ) 5 MG tablet Take 1 tablet (5 mg total) by mouth daily. 90 tablet 1   levothyroxine  (SYNTHROID ) 100 MCG tablet Take 1 tablet (100 mcg total) by mouth daily before breakfast. 90 tablet 1   rosuvastatin  (CRESTOR ) 20 MG tablet Take 1 tablet (20 mg total) by mouth daily. 90 tablet 1   sertraline  (ZOLOFT ) 100 MG tablet Take 1 tablet (100 mg total) by mouth daily. 90 tablet 3   No facility-administered medications prior to visit.    Allergies  Allergen Reactions   Aspirin Swelling    Review of Systems  Constitutional:  Negative for chills, fever and malaise/fatigue.  Respiratory:  Negative for shortness of breath.   Cardiovascular:  Negative for chest pain, palpitations and leg swelling.  Gastrointestinal:  Negative for abdominal pain, constipation, diarrhea, nausea and vomiting.  Genitourinary:  Negative for dysuria, frequency and urgency.  Neurological:  Negative for dizziness, focal weakness and headaches.       Objective:    Physical Exam Constitutional:      General: He is not in acute distress.    Appearance: He is not ill-appearing.  Eyes:     Extraocular Movements: Extraocular movements intact.     Conjunctiva/sclera: Conjunctivae normal.  Cardiovascular:     Rate and Rhythm: Normal rate.  Pulmonary:     Effort: Pulmonary effort is normal.  Musculoskeletal:     Cervical back: Normal range of motion and neck supple.  Skin:    General: Skin is warm and dry.     Comments: Yellowish, thickened toenails throughout   Neurological:     General: No focal deficit present.     Mental Status: He is alert and oriented to person, place, and time.  Psychiatric:        Mood and Affect: Mood normal.         Behavior: Behavior normal.        Thought Content: Thought content normal.      BP 130/80 (BP Location: Left Arm, Patient Position: Sitting)   Pulse (!) 58   Temp 97.8 F (36.6 C) (Temporal)   Ht 5' 11 (1.803 m)   Wt 198 lb 12.8 oz (90.2 kg)   SpO2 97%   BMI 27.73 kg/m  Wt Readings from Last 3 Encounters:  09/02/24 198  lb 12.8 oz (90.2 kg)  07/22/24 195 lb (88.5 kg)  06/11/23 203 lb (92.1 kg)       Assessment & Plan:   Problem List Items Addressed This Visit   None Visit Diagnoses       Onychomycosis    -  Primary   Relevant Medications   terbinafine (LAMISIL) 250 MG tablet     Mixed hyperlipidemia           Assessment and Plan Assessment & Plan Onychomycosis (toenail fungus) Chronic onychomycosis affecting multiple toenails, with the right foot more severely affected. Previous oral antifungal treatment was incomplete. Topical treatments are ineffective due to poor penetration and long nail growth cycle. Oral antifungal therapy is considered with liver function monitoring due to potential hepatotoxicity. He prefers oral antifungal therapy before considering podiatry referral. - Prescribe oral antifungal medication for one month - Monitor liver function after one month of treatment - Consider podiatry referral if no improvement or if liver function tests are elevated  Hyperlipidemia Hyperlipidemia with previous non-compliance to medication. Currently resumed medication for the past six weeks. Non-fasting state today, so lipid levels cannot be accurately assessed. - Continue current cholesterol medication - Schedule fasting lipid panel in four weeks     I am having Jeremiah Turner start on terbinafine. I am also having him maintain his rosuvastatin , amLODipine , sertraline , and levothyroxine .  Meds ordered this encounter  Medications   terbinafine (LAMISIL) 250 MG tablet    Sig: Take 1 tablet (250 mg total) by mouth daily.    Dispense:  30 tablet    Refill:  0     Supervising Provider:   ROLLENE NORRIS A [[id number]]

## 2024-09-02 NOTE — Patient Instructions (Signed)
 Take the medication by mouth as prescribed.  Follow-up with me before you are due for your next refill.  Come in fasting and hydrated and we will recheck your cholesterol as well.

## 2024-09-05 ENCOUNTER — Telehealth: Payer: Self-pay | Admitting: Pharmacy Technician

## 2024-09-05 ENCOUNTER — Other Ambulatory Visit (HOSPITAL_COMMUNITY): Payer: Self-pay

## 2024-09-05 NOTE — Telephone Encounter (Signed)
 Pharmacy Patient Advocate Encounter   Received notification from CoverMyMeds that prior authorization for Terbinafine HCl 250MG  tablets  is required/requested.   Insurance verification completed.   The patient is insured through [facility].   Per test claim: PA required; PA submitted to above mentioned insurance via Latent Key/confirmation #/EOC [id number] Status is pending

## 2024-09-06 ENCOUNTER — Other Ambulatory Visit: Payer: Self-pay | Admitting: Family Medicine

## 2024-09-06 NOTE — Telephone Encounter (Signed)
 Rx was denied, is there an alternative that can be used/sent?

## 2024-09-06 NOTE — Telephone Encounter (Signed)
 LM for pt to return my call

## 2024-09-06 NOTE — Telephone Encounter (Signed)
 Pharmacy Patient Advocate Encounter  Received notification from [facility] that Prior Authorization for Terbinafine HCl 250MG  tablets  has been DENIED.  Full denial letter will be uploaded to the media tab. See denial reason below.     PA #/Case ID/Reference #: [id number]

## 2024-09-28 ENCOUNTER — Encounter: Payer: Self-pay | Admitting: Gastroenterology

## 2024-09-29 ENCOUNTER — Other Ambulatory Visit: Payer: Self-pay | Admitting: Family Medicine

## 2024-09-30 ENCOUNTER — Ambulatory Visit: Admitting: Family Medicine

## 2024-10-13 ENCOUNTER — Other Ambulatory Visit: Payer: Self-pay | Admitting: Family Medicine

## 2024-10-13 ENCOUNTER — Ambulatory Visit (INDEPENDENT_AMBULATORY_CARE_PROVIDER_SITE_OTHER): Admitting: Family Medicine

## 2024-10-13 ENCOUNTER — Ambulatory Visit: Payer: Self-pay | Admitting: Family Medicine

## 2024-10-13 ENCOUNTER — Encounter: Payer: Self-pay | Admitting: Family Medicine

## 2024-10-13 VITALS — BP 122/68 | HR 68 | Temp 97.9°F | Ht 71.0 in | Wt 199.0 lb

## 2024-10-13 DIAGNOSIS — I10 Essential (primary) hypertension: Secondary | ICD-10-CM

## 2024-10-13 DIAGNOSIS — B351 Tinea unguium: Secondary | ICD-10-CM

## 2024-10-13 DIAGNOSIS — E782 Mixed hyperlipidemia: Secondary | ICD-10-CM

## 2024-10-13 DIAGNOSIS — Z79899 Other long term (current) drug therapy: Secondary | ICD-10-CM

## 2024-10-13 LAB — HEPATIC FUNCTION PANEL
ALT: 19 U/L (ref 0–53)
AST: 17 U/L (ref 0–37)
Albumin: 4.7 g/dL (ref 3.5–5.2)
Alkaline Phosphatase: 62 U/L (ref 39–117)
Bilirubin, Direct: 0.1 mg/dL (ref 0.0–0.3)
Total Bilirubin: 0.6 mg/dL (ref 0.2–1.2)
Total Protein: 7.2 g/dL (ref 6.0–8.3)

## 2024-10-13 LAB — LIPID PANEL
Cholesterol: 142 mg/dL (ref 0–200)
HDL: 48.3 mg/dL (ref >39.00)
LDL Cholesterol: 71 mg/dL (ref 0–99)
NonHDL: 93.28
Total CHOL/HDL Ratio: 3
Triglycerides: 111 mg/dL (ref 0.0–149.0)
VLDL: 22.2 mg/dL (ref 0.0–40.0)

## 2024-10-13 LAB — BASIC METABOLIC PANEL WITH GFR
BUN: 17 mg/dL (ref 6–23)
CO2: 27 meq/L (ref 19–32)
Calcium: 9.4 mg/dL (ref 8.4–10.5)
Chloride: 103 meq/L (ref 96–112)
Creatinine, Ser: 0.78 mg/dL (ref 0.40–1.50)
GFR: 100.46 mL/min (ref >60.00)
Glucose, Bld: 86 mg/dL (ref 70–99)
Potassium: 4.3 meq/L (ref 3.5–5.1)
Sodium: 138 meq/L (ref 135–145)

## 2024-10-13 MED ORDER — TERBINAFINE HCL 250 MG PO TABS: 250.0000 mg | ORAL_TABLET | Freq: Every day | ORAL | 1 refills | Status: DC

## 2024-10-13 NOTE — Addendum Note (Signed)
 Addended by: Summit Arroyave L on: 10/13/2024 04:40 PM   Modules accepted: Orders

## 2024-10-13 NOTE — Progress Notes (Addendum)
 Subjective:     Patient ID: Jeremiah Turner, male    DOB: [birth year], 53 y.o.   MRN: [id number]  Chief Complaint  Patient presents with   Medical Management of Chronic Issues    F/u on toe and fasting to recheck cholesterol     HPI  Discussed the use of AI scribe software for clinical note transcription with the patient, who gave verbal consent to proceed.  History of Present Illness Jeremiah Turner is a 53 year old male with onychomycosis and hyperlipidemia who presents for follow-up.  Onychomycosis - Undergoing treatment for four weeks with noticeable improvement in both feet - nails are improving with obvious visible areas - New nail growth observed with estimated 20-30% improvement - One foot less severely affected than the other  Hyperlipidemia - On medication for one month - Fasted for cholesterol testing at this visit - Previous cholesterol levels elevated due to prior non-compliance with medication - Thyroid levels previously normal - Hopeful for decrease in triglyceride levels  Impact of work schedule - Works second shift     Health Maintenance Due  Topic Date Due   DTaP/Tdap/Td (1 - Tdap) Never done   Pneumococcal Vaccine: 50+ Years (1 of 2 - PCV) Never done   Hepatitis B Vaccines 19-59 Average Risk (1 of 3 - 19+ 3-dose series) Never done   Zoster Vaccines- Shingrix (1 of 2) Never done   Colonoscopy  Never done   Lung Cancer Screening  Never done   Influenza Vaccine  Never done   COVID-19 Vaccine (4 - 2025-26 season) 08/01/2024    Past Medical History:  Diagnosis Date   ED (erectile dysfunction)    Hyperlipidemia    Hypertension    Hypothyroidism     Past Surgical History:  Procedure Laterality Date   GANGLION CYST EXCISION     REPAIR KNEE LIGAMENT     THUMB AMPUTATION     WRIST SURGERY      History reviewed. No pertinent family history.  Social History   Socioeconomic History   Marital status: Married    Spouse name: Not on file    Number of children: 1   Years of education: Not on file   Highest education level: GED or equivalent  Occupational History    Employer: [employer]  Tobacco Use   Smoking status: Every Day    Current packs/day: 1.50    Average packs/day: 1.5 packs/day for 30.0 years (45.0 ttl pk-yrs)    Types: Cigarettes   Smokeless tobacco: Not on file  Substance and Sexual Activity   Alcohol  use: Yes    Alcohol /week: 4.0 standard drinks of alcohol     Types: 4 Standard drinks or equivalent per week   Drug use: No   Sexual activity: Yes    Birth control/protection: None  Other Topics Concern   Not on file  Social History Narrative   Not on file   Social Drivers of Health   Financial Resource Strain: Low Risk  (07/22/2024)   Overall Financial Resource Strain (CARDIA)    Difficulty of Paying Living Expenses: Not very hard  Food Insecurity: No Food Insecurity (07/22/2024)   Hunger Vital Sign    Worried About Running Out of Food in the Last Year: Never true    Ran Out of Food in the Last Year: Never true  Transportation Needs: No Transportation Needs (07/22/2024)   PRAPARE - Transportation    Lack of Transportation (Medical): No    Lack of Transportation (Non-Medical):  No  Physical Activity: Unknown (07/22/2024)   Exercise Vital Sign    Days of Exercise per Week: 5 days    Minutes of Exercise per Session: Not on file  Stress: No Stress Concern Present (07/22/2024)   [employer] of Occupational Health - Occupational Stress Questionnaire    Feeling of Stress: Not at all  Social Connections: Moderately Isolated (07/22/2024)   Social Connection and Isolation Panel    Frequency of Communication with Friends and Family: Once a week    Frequency of Social Gatherings with Friends and Family: Once a week    Attends Religious Services: More than 4 times per year    Active Member of [employer] or Organizations: No    Attends Engineer, Structural: Not on file    Marital Status: Married  Intimate  Partner Violence: Unknown (03/06/2022)   Received from [facility]   HITS    Physically Hurt: Not on file    Insult or Talk Down To: Not on file    Threaten Physical Harm: Not on file    Scream or Curse: Not on file    Outpatient Medications Prior to Visit  Medication Sig Dispense Refill   amLODipine  (NORVASC ) 5 MG tablet Take 1 tablet (5 mg total) by mouth daily. 90 tablet 1   levothyroxine  (SYNTHROID ) 100 MCG tablet Take 1 tablet (100 mcg total) by mouth daily before breakfast. 90 tablet 1   rosuvastatin  (CRESTOR ) 20 MG tablet Take 1 tablet (20 mg total) by mouth daily. 90 tablet 1   sertraline  (ZOLOFT ) 100 MG tablet Take 1 tablet (100 mg total) by mouth daily. 90 tablet 3   terbinafine (LAMISIL) 250 MG tablet Take 1 tablet (250 mg total) by mouth daily. 30 tablet 0   No facility-administered medications prior to visit.    Allergies  Allergen Reactions   Aspirin Swelling    Review of Systems  Constitutional:  Negative for chills and fever.  Respiratory:  Negative for shortness of breath.   Cardiovascular:  Negative for chest pain, palpitations and leg swelling.  Gastrointestinal:  Negative for abdominal pain, constipation, diarrhea, nausea and vomiting.  Genitourinary:  Negative for dysuria, frequency and urgency.  Neurological:  Negative for dizziness and headaches.       Objective:    Physical Exam Constitutional:      General: He is not in acute distress.    Appearance: He is not ill-appearing.  Eyes:     Extraocular Movements: Extraocular movements intact.     Conjunctiva/sclera: Conjunctivae normal.  Cardiovascular:     Rate and Rhythm: Normal rate.  Pulmonary:     Effort: Pulmonary effort is normal.  Musculoskeletal:     Cervical back: Normal range of motion and neck supple.  Skin:    General: Skin is warm and dry.     Comments: Great toenails with yellowing and thickening of nails bilaterally with clearing noted to distal nailbed   Neurological:      General: No focal deficit present.     Mental Status: He is alert and oriented to person, place, and time.  Psychiatric:        Mood and Affect: Mood normal.        Behavior: Behavior normal.        Thought Content: Thought content normal.      BP 122/68   Pulse 68   Temp 97.9 F (36.6 C) (Temporal)   Ht 5' 11 (1.803 m)   Wt 199 lb (90.3 kg)  SpO2 97%   BMI 27.75 kg/m  Wt Readings from Last 3 Encounters:  10/13/24 199 lb (90.3 kg)  09/02/24 198 lb 12.8 oz (90.2 kg)  07/22/24 195 lb (88.5 kg)       Assessment & Plan:   Problem List Items Addressed This Visit   None Visit Diagnoses       Mixed hyperlipidemia    -  Primary   Relevant Orders   Lipid panel (Completed)     Onychomycosis       Relevant Medications   terbinafine (LAMISIL) 250 MG tablet     Primary hypertension       Relevant Orders   Basic metabolic panel with GFR (Completed)   Hepatic function panel (Completed)     High risk medication use       Relevant Orders   Basic metabolic panel with GFR (Completed)   Hepatic function panel (Completed)       Assessment and Plan Assessment & Plan Onychomycosis Improving with treatment, showing a clearing and new nail growth. The other foot is less affected. Treatment duration can extend up to twelve weeks. Liver function needs monitoring due to potential side effects of the medication. If not fully resolved after three months, laser therapy at podiatry is an option. - Continue current treatment for onychomycosis. - Monitor liver function tests. - Will consider laser therapy at podiatry if not fully resolved after three months.  Mixed hyperlipidemia Managed with rosuvastatin . Cholesterol levels were not optimal previously due to non-adherence. He has been on medication for a month and is fasting for cholesterol testing today. - Ordered fasting lipid panel. - Continue rosuvastatin  20 MG oral daily.     I am having Jeremiah Turner maintain his  rosuvastatin , amLODipine , sertraline , levothyroxine , and terbinafine.  Meds ordered this encounter  Medications   terbinafine (LAMISIL) 250 MG tablet    Sig: Take 1 tablet (250 mg total) by mouth daily.    Dispense:  30 tablet    Refill:  1    Supervising Provider:   ROLLENE NORRIS A [[id number]]

## 2024-10-14 ENCOUNTER — Telehealth: Payer: Self-pay

## 2024-10-14 ENCOUNTER — Other Ambulatory Visit (HOSPITAL_COMMUNITY): Payer: Self-pay

## 2024-10-14 NOTE — Telephone Encounter (Signed)
 Pharmacy Patient Advocate Encounter   Received notification from RX Request Messages that prior authorization for Terbinafine 250mg  tabs is required/requested.   Insurance verification completed.   The patient is insured through [facility].   Per test claim: PA required; PA submitted to above mentioned insurance via Latent Key/confirmation #/EOC [id number] Status is pending

## 2024-10-17 NOTE — Telephone Encounter (Signed)
 Terbinafine 250mg  tabs has been DENIED

## 2024-10-17 NOTE — Telephone Encounter (Signed)
 Pharmacy Patient Advocate Encounter  Received notification from [facility] that Prior Authorization for Terbinafine 250mg  tabs has been DENIED.  See denial reason below. No denial letter attached in CMM. Will attach denial letter to Media tab once received.   PA #/Case ID/Reference #: [id number]

## 2024-10-18 NOTE — Telephone Encounter (Signed)
 LM for pt can discuss if he wants to return call or can respond via mychart

## 2024-10-26 ENCOUNTER — Ambulatory Visit (AMBULATORY_SURGERY_CENTER): Admitting: *Deleted

## 2024-10-26 ENCOUNTER — Encounter: Payer: Self-pay | Admitting: Gastroenterology

## 2024-10-26 VITALS — Ht 71.0 in | Wt 199.0 lb

## 2024-10-26 DIAGNOSIS — Z1211 Encounter for screening for malignant neoplasm of colon: Secondary | ICD-10-CM

## 2024-10-26 MED ORDER — NA SULFATE-K SULFATE-MG SULF 17.5-3.13-1.6 GM/177ML PO SOLN: 1.0000 | Freq: Once | ORAL | 0 refills | Status: AC

## 2024-10-26 NOTE — Progress Notes (Signed)
 Pt's name and DOB verified at the beginning of the pre-visit with 2 identifiers  Pt denies any difficulty with ambulating,sitting, laying down or rolling side to side  Pt has no issues moving head neck or swallowing  No egg or soy allergy known to patient   No issues known to pt with past sedation  No FH of Malignant Hyperthermia  Pt is not on home 02   Pt is not on blood thinners   Pt denies issues with constipation   Pt is not on dialysis  Pt denise any abnormal heart rhythms   Pt denies any upcoming cardiac testing  Patient's chart reviewed by Norleen Schillings CNRA prior to pre-visit and patient appropriate for the [facility].  Pre-visit completed and red dot placed by patient's name on their procedure day (on provider's schedule).     Visit by phone   Pt states weight is 199 lb   Pt given  both [facility] main # and MD on call # prior to instructions.  Informed pt to come in at the time discussed and is shown on PV instructions.  Pt instructed to use Singlecare.com or GoodRx for a price reduction on prep  Instructed pt where to find PV instructions in My Chart.  Instructed pt on all aspects of written instructions including med holds clothing to wear and foods to eat and not eat as well as after procedure legal restrictions and to call MD on call if needed.. Pt states understanding. Instructed pt to review instructions again prior to procedure and call main # given if has any questions or any issues. Pt states they will.

## 2024-11-01 ENCOUNTER — Encounter: Payer: Self-pay | Admitting: Gastroenterology

## 2024-11-03 ENCOUNTER — Encounter: Payer: Self-pay | Admitting: Gastroenterology

## 2024-11-04 ENCOUNTER — Ambulatory Visit: Admitting: Gastroenterology

## 2024-11-04 ENCOUNTER — Encounter: Payer: Self-pay | Admitting: Gastroenterology

## 2024-11-04 VITALS — BP 140/66 | HR 47 | Temp 98.2°F | Resp 11 | Ht 71.0 in | Wt 199.0 lb

## 2024-11-04 DIAGNOSIS — D128 Benign neoplasm of rectum: Secondary | ICD-10-CM

## 2024-11-04 DIAGNOSIS — D124 Benign neoplasm of descending colon: Secondary | ICD-10-CM

## 2024-11-04 DIAGNOSIS — Z1211 Encounter for screening for malignant neoplasm of colon: Secondary | ICD-10-CM

## 2024-11-04 DIAGNOSIS — D125 Benign neoplasm of sigmoid colon: Secondary | ICD-10-CM

## 2024-11-04 DIAGNOSIS — K621 Rectal polyp: Secondary | ICD-10-CM

## 2024-11-04 DIAGNOSIS — D123 Benign neoplasm of transverse colon: Secondary | ICD-10-CM

## 2024-11-04 DIAGNOSIS — K635 Polyp of colon: Secondary | ICD-10-CM | POA: Diagnosis not present

## 2024-11-04 MED ORDER — SODIUM CHLORIDE 0.9 % IV SOLN: 500.0000 mL | Freq: Once | INTRAVENOUS | Status: DC

## 2024-11-04 NOTE — Progress Notes (Signed)
 Sedate, gd SR, tolerated procedure well, VSS, report to RN

## 2024-11-04 NOTE — Progress Notes (Signed)
 GASTROENTEROLOGY PROCEDURE H&P NOTE   Primary Care Physician: Lendia Boby CROME, NP-C    Reason for Procedure:  Colon Cancer screening  Plan:    Colonoscopy  Patient is appropriate for endoscopic procedure(s) in the ambulatory ([facility]) setting.  The nature of the procedure, as well as the risks, benefits, and alternatives were carefully and thoroughly reviewed with the patient. Ample time for discussion and questions allowed. The patient understood, was satisfied, and agreed to proceed. I personally addressed all patient questions and concerns.     HPI: Jeremiah Turner is a 53 y.o. male who presents for colonoscopy for routine Colon Cancer screening.  No active GI symptoms.  No known family history of colon cancer or related malignancy.  Patient is otherwise without complaints or active issues today.  Past Medical History:  Diagnosis Date   Allergy    Chronic kidney disease    stones   ED (erectile dysfunction)    Hyperlipidemia    Hypertension    Hypothyroidism     Past Surgical History:  Procedure Laterality Date   GANGLION CYST EXCISION     REPAIR KNEE LIGAMENT     THUMB AMPUTATION     WRIST SURGERY      Prior to Admission medications   Medication Sig Start Date End Date Taking? Authorizing Provider  amLODipine  (NORVASC ) 5 MG tablet Take 1 tablet (5 mg total) by mouth daily. 07/22/24  Yes Henson, Vickie L, NP-C  levothyroxine  (SYNTHROID ) 100 MCG tablet Take 1 tablet (100 mcg total) by mouth daily before breakfast. 07/24/24  Yes Henson, Vickie L, NP-C  rosuvastatin  (CRESTOR ) 20 MG tablet Take 1 tablet (20 mg total) by mouth daily. 07/22/24  Yes Henson, Vickie L, NP-C  sertraline  (ZOLOFT ) 100 MG tablet Take 1 tablet (100 mg total) by mouth daily. 07/22/24  Yes Henson, Vickie L, NP-C  terbinafine  (LAMISIL ) 250 MG tablet Take 1 tablet (250 mg total) by mouth daily. 10/13/24   Lendia Boby CROME, NP-C    Current Outpatient Medications  Medication Sig Dispense Refill    amLODipine  (NORVASC ) 5 MG tablet Take 1 tablet (5 mg total) by mouth daily. 90 tablet 1   levothyroxine  (SYNTHROID ) 100 MCG tablet Take 1 tablet (100 mcg total) by mouth daily before breakfast. 90 tablet 1   rosuvastatin  (CRESTOR ) 20 MG tablet Take 1 tablet (20 mg total) by mouth daily. 90 tablet 1   sertraline  (ZOLOFT ) 100 MG tablet Take 1 tablet (100 mg total) by mouth daily. 90 tablet 3   terbinafine  (LAMISIL ) 250 MG tablet Take 1 tablet (250 mg total) by mouth daily. 30 tablet 1   Current Facility-Administered Medications  Medication Dose Route Frequency Provider Last Rate Last Admin   0.9 %  sodium chloride  infusion  500 mL Intravenous Once Lynnley Doddridge V, DO        Allergies as of 11/04/2024   (No Known Allergies)    Family History  Problem Relation Age of Onset   Rectal cancer Neg Hx    Stomach cancer Neg Hx    Esophageal cancer Neg Hx    Colon cancer Neg Hx    Colon polyps Neg Hx     Social History   Socioeconomic History   Marital status: Married    Spouse name: Not on file   Number of children: 1   Years of education: Not on file   Highest education level: GED or equivalent  Occupational History    Employer: [employer]  Tobacco Use  Smoking status: Every Day    Current packs/day: 1.50    Average packs/day: 1.5 packs/day for 30.0 years (45.0 ttl pk-yrs)    Types: Cigarettes   Smokeless tobacco: Not on file  Vaping Use   Vaping status: Never Used  Substance and Sexual Activity   Alcohol  use: Yes    Alcohol /week: 4.0 standard drinks of alcohol     Types: 4 Standard drinks or equivalent per week   Drug use: No   Sexual activity: Yes    Birth control/protection: None  Other Topics Concern   Not on file  Social History Narrative   Not on file   Social Drivers of Health   Financial Resource Strain: Low Risk  (07/22/2024)   Overall Financial Resource Strain (CARDIA)    Difficulty of Paying Living Expenses: Not very hard  Food Insecurity: No Food Insecurity  (07/22/2024)   Hunger Vital Sign    Worried About Running Out of Food in the Last Year: Never true    Ran Out of Food in the Last Year: Never true  Transportation Needs: No Transportation Needs (07/22/2024)   PRAPARE - Transportation    Lack of Transportation (Medical): No    Lack of Transportation (Non-Medical): No  Physical Activity: Unknown (07/22/2024)   Exercise Vital Sign    Days of Exercise per Week: 5 days    Minutes of Exercise per Session: Not on file  Stress: No Stress Concern Present (07/22/2024)   [employer] of Occupational Health - Occupational Stress Questionnaire    Feeling of Stress: Not at all  Social Connections: Moderately Isolated (07/22/2024)   Social Connection and Isolation Panel    Frequency of Communication with Friends and Family: Once a week    Frequency of Social Gatherings with Friends and Family: Once a week    Attends Religious Services: More than 4 times per year    Active Member of [employer] or Organizations: No    Attends Engineer, Structural: Not on file    Marital Status: Married  Intimate Partner Violence: Unknown (03/06/2022)   Received from [facility]   HITS    Physically Hurt: Not on file    Insult or Talk Down To: Not on file    Threaten Physical Harm: Not on file    Scream or Curse: Not on file    Physical Exam: Vital signs in last 24 hours: @BP  (!) 142/85   Pulse 60   Temp 98.2 F (36.8 C)   Ht 5' 11 (1.803 m)   Wt 199 lb (90.3 kg)   SpO2 97%   BMI 27.75 kg/m  GEN: NAD EYE: Sclerae anicteric ENT: MMM CV: Non-tachycardic Pulm: CTA b/l GI: Soft, NT/ND NEURO:  Alert & Oriented x 3   Sandor Flatter, DO [facility]   11/04/2024 2:30 PM

## 2024-11-04 NOTE — Progress Notes (Signed)
 Called to room to assist during endoscopic procedure.  Patient ID and intended procedure confirmed with present staff. Received instructions for my participation in the procedure from the performing physician.

## 2024-11-04 NOTE — Op Note (Signed)
 [facility] Patient Name: Jeremiah Turner Procedure Date: 11/04/2024 2:25 PM MRN: [id number] Endoscopist: Sandor Flatter , MD, [id number] Age: 53 Referring MD:  Date of Birth: 1971-06-16 Gender: Male Account #: [id number] Procedure:                Colonoscopy Indications:              Screening for colorectal malignant neoplasm, This                            is the patient's first colonoscopy Medicines:                Monitored Anesthesia Care Procedure:                Pre-Anesthesia Assessment:                           - Prior to the procedure, a History and Physical                            was performed, and patient medications and                            allergies were reviewed. The patient's tolerance of                            previous anesthesia was also reviewed. The risks                            and benefits of the procedure and the sedation                            options and risks were discussed with the patient.                            All questions were answered, and informed consent                            was obtained. Prior Anticoagulants: The patient has                            taken no anticoagulant or antiplatelet agents. ASA                            Grade Assessment: II - A patient with mild systemic                            disease. After reviewing the risks and benefits,                            the patient was deemed in satisfactory condition to                            undergo the procedure.  After obtaining informed consent, the colonoscope                            was passed under direct vision. Throughout the                            procedure, the patient's blood pressure, pulse, and                            oxygen saturations were monitored continuously. The                            Olympus Scope SN: [id number] was introduced through                            the anus and advanced to  the the cecum, identified                            by appendiceal orifice and ileocecal valve. The                            colonoscopy was performed without difficulty. The                            patient tolerated the procedure well. The quality                            of the bowel preparation was fair. The ileocecal                            valve, appendiceal orifice, and rectum were                            photographed. Scope In: 2:35:32 PM Scope Out: 3:00:13 PM Scope Withdrawal Time: 0 hours 22 minutes 4 seconds  Total Procedure Duration: 0 hours 24 minutes 41 seconds  Findings:                 The perianal and digital rectal examinations were                            normal.                           There was a small lipoma, in the ascending colon.                           An 8 mm polyp was found in the transverse colon.                            The polyp was sessile. The polyp was removed with a                            cold snare. Resection and retrieval were complete.  Estimated blood loss was minimal.                           Seven sessile polyps were found in the sigmoid                            colon and descending colon. The polyps were 2 to 5                            mm in size. These polyps were removed with a cold                            snare. Resection and retrieval were complete.                            Estimated blood loss was minimal.                           Three sessile polyps were found in the rectum. The                            polyps were 2 to 4 mm in size. These polyps were                            removed with a cold snare. Resection and retrieval                            were complete. Estimated blood loss was minimal.                           A moderate amount of semi-liquid stool was found in                            the sigmoid colon, in the ascending colon and in                             the cecum, interfering with visualization. Lavage                            of the area was performed using copious amounts of                            sterile water, resulting in incomplete clearance                            with fair visualization. Cannot rule out the                            presence of small or flat polyps in these areas.                           The retroflexed view of the distal rectum and anal  verge was normal and showed no anal or rectal                            abnormalities. Complications:            No immediate complications. Estimated Blood Loss:     Estimated blood loss was minimal. Impression:               - Preparation of the colon was fair.                           - Small lipoma in the ascending colon.                           - One 8 mm polyp in the transverse colon, removed                            with a cold snare. Resected and retrieved.                           - Seven 2 to 5 mm polyps in the sigmoid colon and                            in the descending colon, removed with a cold snare.                            Resected and retrieved.                           - Three 2 to 4 mm polyps in the rectum, removed                            with a cold snare. Resected and retrieved.                           - Stool in the sigmoid colon, in the ascending                            colon and in the cecum.                           - The distal rectum and anal verge are normal on                            retroflexion view. Recommendation:           - Patient has a contact number available for                            emergencies. The signs and symptoms of potential                            delayed complications were discussed with the  patient. Return to normal activities tomorrow.                            Written discharge instructions were provided to the                             patient.                           - Resume previous diet.                           - Continue present medications.                           - Await pathology results.                           - Repeat colonoscopy in 1 year because the bowel                            preparation was suboptimal and for surveillance.                           - Return to GI office PRN. Sandor Flatter, MD 11/04/2024 3:06:42 PM

## 2024-11-04 NOTE — Patient Instructions (Signed)
Await pathology results.  Handout on polyps provided.  YOU HAD AN ENDOSCOPIC PROCEDURE TODAY AT THE [facility]:   Refer to the procedure report that was given to you for any specific questions about what was found during the examination.  If the procedure report does not answer your questions, please call your gastroenterologist to clarify.  If you requested that your care partner not be given the details of your procedure findings, then the procedure report has been included in a sealed envelope for you to review at your convenience later.  YOU SHOULD EXPECT: Some feelings of bloating in the abdomen. Passage of more gas than usual.  Walking can help get rid of the air that was put into your GI tract during the procedure and reduce the bloating. If you had a lower endoscopy (such as a colonoscopy or flexible sigmoidoscopy) you may notice spotting of blood in your stool or on the toilet paper. If you underwent a bowel prep for your procedure, you may not have a normal bowel movement for a few days.  Please Note:  You might notice some irritation and congestion in your nose or some drainage.  This is from the oxygen used during your procedure.  There is no need for concern and it should clear up in a day or so.  SYMPTOMS TO REPORT IMMEDIATELY:  Following lower endoscopy (colonoscopy or flexible sigmoidoscopy):  Excessive amounts of blood in the stool  Significant tenderness or worsening of abdominal pains  Swelling of the abdomen that is new, acute  Fever of 100F or higher   For urgent or emergent issues, a gastroenterologist can be reached at any hour by calling [phone]. Do not use MyChart messaging for urgent concerns.    DIET:  We do recommend a small meal at first, but then you may proceed to your regular diet.  Drink plenty of fluids but you should avoid alcoholic beverages for 24 hours.  ACTIVITY:  You should plan to take it easy for the rest of today and you should  NOT DRIVE or use heavy machinery until tomorrow (because of the sedation medicines used during the test).    FOLLOW UP: Our staff will call the number listed on your records the next business day following your procedure.  We will call around 7:15- 8:00 am to check on you and address any questions or concerns that you may have regarding the information given to you following your procedure. If we do not reach you, we will leave a message.     If any biopsies were taken you will be contacted by phone or by letter within the next 1-3 weeks.  Please call us at [phone] if you have not heard about the biopsies in 3 weeks.    SIGNATURES/CONFIDENTIALITY: You and/or your care partner have signed paperwork which will be entered into your electronic medical record.  These signatures attest to the fact that that the information above on your After Visit Summary has been reviewed and is understood.  Full responsibility of the confidentiality of this discharge information lies with you and/or your care-partner.  

## 2024-11-07 ENCOUNTER — Telehealth: Payer: Self-pay | Admitting: Lactation Services

## 2024-11-07 NOTE — Telephone Encounter (Signed)
 No answer left voice mail

## 2024-11-09 LAB — SURGICAL PATHOLOGY

## 2024-11-14 ENCOUNTER — Ambulatory Visit: Payer: Self-pay | Admitting: Gastroenterology

## 2024-11-15 ENCOUNTER — Other Ambulatory Visit (HOSPITAL_COMMUNITY): Payer: Self-pay

## 2024-12-29 ENCOUNTER — Other Ambulatory Visit: Payer: Self-pay | Admitting: Family Medicine

## 2025-01-24 ENCOUNTER — Encounter: Admitting: Family Medicine
